# Patient Record
Sex: Female | Born: 1955 | Hispanic: No | Marital: Married | Smoking: Never smoker
Health system: Southern US, Community
[De-identification: ages and names within clinical notes are randomized; demographics above are authoritative.]

## PROBLEM LIST (undated history)

## (undated) DIAGNOSIS — I10 Essential (primary) hypertension: Secondary | ICD-10-CM

## (undated) DIAGNOSIS — K219 Gastro-esophageal reflux disease without esophagitis: Secondary | ICD-10-CM

## (undated) DIAGNOSIS — E785 Hyperlipidemia, unspecified: Secondary | ICD-10-CM

## (undated) DIAGNOSIS — F419 Anxiety disorder, unspecified: Secondary | ICD-10-CM

## (undated) DIAGNOSIS — F32A Depression, unspecified: Secondary | ICD-10-CM

## (undated) DIAGNOSIS — I8390 Asymptomatic varicose veins of unspecified lower extremity: Secondary | ICD-10-CM

## (undated) DIAGNOSIS — J302 Other seasonal allergic rhinitis: Secondary | ICD-10-CM

## (undated) DIAGNOSIS — F329 Major depressive disorder, single episode, unspecified: Secondary | ICD-10-CM

## (undated) DIAGNOSIS — D649 Anemia, unspecified: Secondary | ICD-10-CM

## (undated) DIAGNOSIS — H269 Unspecified cataract: Secondary | ICD-10-CM

## (undated) DIAGNOSIS — M25561 Pain in right knee: Secondary | ICD-10-CM

## (undated) DIAGNOSIS — M8580 Other specified disorders of bone density and structure, unspecified site: Secondary | ICD-10-CM

## (undated) DIAGNOSIS — M1990 Unspecified osteoarthritis, unspecified site: Secondary | ICD-10-CM

## (undated) HISTORY — DX: Gastro-esophageal reflux disease without esophagitis: K21.9

## (undated) HISTORY — DX: Essential (primary) hypertension: I10

## (undated) HISTORY — DX: Unspecified cataract: H26.9

## (undated) HISTORY — PX: NO PAST SURGERIES: SHX2092

## (undated) HISTORY — DX: Hyperlipidemia, unspecified: E78.5

## (undated) HISTORY — DX: Anemia, unspecified: D64.9

## (undated) HISTORY — DX: Unspecified osteoarthritis, unspecified site: M19.90

## (undated) HISTORY — DX: Depression, unspecified: F32.A

## (undated) HISTORY — DX: Other specified disorders of bone density and structure, unspecified site: M85.80

## (undated) HISTORY — DX: Pain in right knee: M25.561

## (undated) HISTORY — DX: Major depressive disorder, single episode, unspecified: F32.9

## (undated) HISTORY — DX: Other seasonal allergic rhinitis: J30.2

## (undated) HISTORY — DX: Asymptomatic varicose veins of unspecified lower extremity: I83.90

## (undated) HISTORY — DX: Anxiety disorder, unspecified: F41.9

## (undated) HISTORY — PX: COLONOSCOPY: SHX174

---

## 1998-09-11 ENCOUNTER — Encounter: Admission: RE | Admit: 1998-09-11 | Discharge: 1998-09-11 | Payer: Self-pay | Admitting: Obstetrics & Gynecology

## 1998-09-24 ENCOUNTER — Ambulatory Visit (HOSPITAL_COMMUNITY): Admission: RE | Admit: 1998-09-24 | Discharge: 1998-09-24 | Payer: Self-pay | Admitting: Obstetrics & Gynecology

## 1998-10-05 ENCOUNTER — Inpatient Hospital Stay (HOSPITAL_COMMUNITY): Admission: AD | Admit: 1998-10-05 | Discharge: 1998-10-05 | Payer: Self-pay | Admitting: *Deleted

## 1998-10-05 ENCOUNTER — Encounter: Admission: RE | Admit: 1998-10-05 | Discharge: 1998-10-05 | Payer: Self-pay | Admitting: Obstetrics & Gynecology

## 1998-10-19 ENCOUNTER — Encounter: Admission: RE | Admit: 1998-10-19 | Discharge: 1998-10-19 | Payer: Self-pay | Admitting: Obstetrics & Gynecology

## 1999-08-14 ENCOUNTER — Encounter: Admission: RE | Admit: 1999-08-14 | Discharge: 1999-10-02 | Payer: Self-pay | Admitting: Family Medicine

## 2000-12-07 ENCOUNTER — Other Ambulatory Visit: Admission: RE | Admit: 2000-12-07 | Discharge: 2000-12-07 | Payer: Self-pay | Admitting: Family Medicine

## 2002-07-01 ENCOUNTER — Other Ambulatory Visit: Admission: RE | Admit: 2002-07-01 | Discharge: 2002-07-01 | Payer: Self-pay | Admitting: Family Medicine

## 2002-11-13 ENCOUNTER — Emergency Department (HOSPITAL_COMMUNITY): Admission: EM | Admit: 2002-11-13 | Discharge: 2002-11-13 | Payer: Self-pay | Admitting: Emergency Medicine

## 2003-05-29 ENCOUNTER — Encounter: Payer: Self-pay | Admitting: Family Medicine

## 2003-05-29 ENCOUNTER — Encounter: Admission: RE | Admit: 2003-05-29 | Discharge: 2003-05-29 | Payer: Self-pay | Admitting: Family Medicine

## 2003-07-19 ENCOUNTER — Other Ambulatory Visit: Admission: RE | Admit: 2003-07-19 | Discharge: 2003-07-19 | Payer: Self-pay | Admitting: Family Medicine

## 2003-11-24 ENCOUNTER — Ambulatory Visit (HOSPITAL_COMMUNITY): Admission: RE | Admit: 2003-11-24 | Discharge: 2003-11-24 | Payer: Self-pay | Admitting: Family Medicine

## 2004-08-08 ENCOUNTER — Emergency Department (HOSPITAL_COMMUNITY): Admission: EM | Admit: 2004-08-08 | Discharge: 2004-08-08 | Payer: Self-pay | Admitting: Emergency Medicine

## 2004-08-09 ENCOUNTER — Encounter: Admission: RE | Admit: 2004-08-09 | Discharge: 2004-08-09 | Payer: Self-pay | Admitting: Family Medicine

## 2005-06-24 ENCOUNTER — Other Ambulatory Visit: Admission: RE | Admit: 2005-06-24 | Discharge: 2005-06-24 | Payer: Self-pay | Admitting: Family Medicine

## 2006-02-16 ENCOUNTER — Encounter: Admission: RE | Admit: 2006-02-16 | Discharge: 2006-02-16 | Payer: Self-pay | Admitting: Family Medicine

## 2006-07-16 ENCOUNTER — Other Ambulatory Visit: Admission: RE | Admit: 2006-07-16 | Discharge: 2006-07-16 | Payer: Self-pay | Admitting: Family Medicine

## 2007-03-21 ENCOUNTER — Emergency Department (HOSPITAL_COMMUNITY): Admission: EM | Admit: 2007-03-21 | Discharge: 2007-03-21 | Payer: Self-pay | Admitting: Emergency Medicine

## 2007-03-31 ENCOUNTER — Emergency Department (HOSPITAL_COMMUNITY): Admission: EM | Admit: 2007-03-31 | Discharge: 2007-03-31 | Payer: Self-pay | Admitting: *Deleted

## 2007-04-30 ENCOUNTER — Encounter: Admission: RE | Admit: 2007-04-30 | Discharge: 2007-04-30 | Payer: Self-pay | Admitting: Family Medicine

## 2008-01-19 ENCOUNTER — Emergency Department (HOSPITAL_BASED_OUTPATIENT_CLINIC_OR_DEPARTMENT_OTHER): Admission: EM | Admit: 2008-01-19 | Discharge: 2008-01-19 | Payer: Self-pay | Admitting: Emergency Medicine

## 2008-02-07 ENCOUNTER — Emergency Department (HOSPITAL_BASED_OUTPATIENT_CLINIC_OR_DEPARTMENT_OTHER): Admission: EM | Admit: 2008-02-07 | Discharge: 2008-02-07 | Payer: Self-pay | Admitting: Emergency Medicine

## 2008-03-06 ENCOUNTER — Ambulatory Visit (HOSPITAL_COMMUNITY): Admission: RE | Admit: 2008-03-06 | Discharge: 2008-03-06 | Payer: Self-pay | Admitting: Gastroenterology

## 2008-03-20 ENCOUNTER — Ambulatory Visit (HOSPITAL_COMMUNITY): Admission: RE | Admit: 2008-03-20 | Discharge: 2008-03-20 | Payer: Self-pay | Admitting: Gastroenterology

## 2008-05-15 ENCOUNTER — Encounter: Admission: RE | Admit: 2008-05-15 | Discharge: 2008-05-15 | Payer: Self-pay | Admitting: Family Medicine

## 2008-09-29 IMAGING — NM NM HEPATO W/GB/PHARM/[PERSON_NAME]
3 series · 13 of 13 positions shown · non-contrast
Comparison: Ultrasound on 03/06/2008

CLINICAL DATA: Right upper quadrant pain

NUCLEAR MEDICINE HEPATOBILIARY IMAGING WITH GALLBLADDER EF
TECHNIQUE: Sequential images of the abdomen were obtained [DATE] minutes following intravenous administration of
radiopharmaceutical. After oral ingestion of 8oz of half and half
cream, gallbladder ejection fraction was determined.
Radiopharmaceutical:  Z.[id number] Cc-RRm Choletec IV

[Series 1: he hepatobiliary · 1 of 1 slices shown (1 of 3)]
[im 1/1]
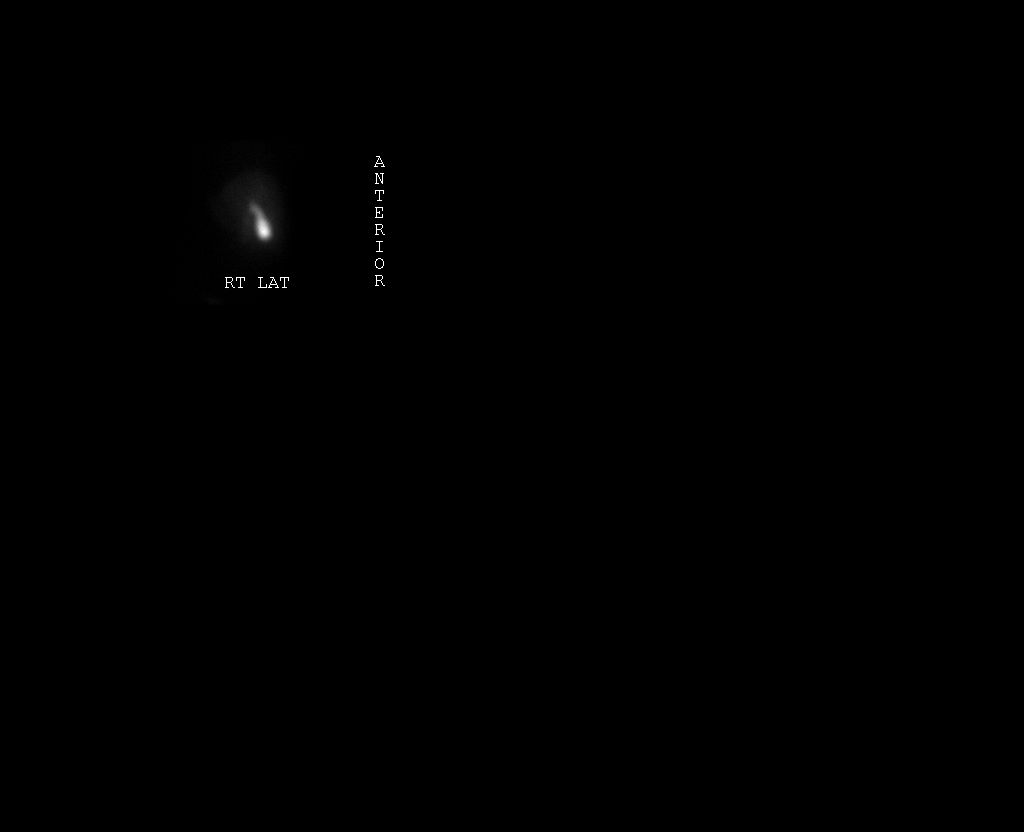

[Series 1: he hepatobiliary · 3.22mm/px · 6 of 60 frames shown (2 of 3)]
[frame 6/60]
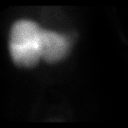
[frame 16/60]
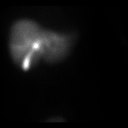
[frame 26/60]
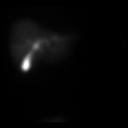
[frame 36/60]
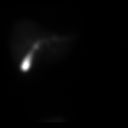
[frame 46/60]
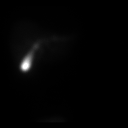
[frame 56/60]
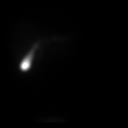

[Series 1: he hepatobiliary · 3.22mm/px · 6 of 60 frames shown (3 of 3)]
[frame 6/60]
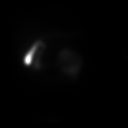
[frame 16/60]
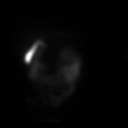
[frame 26/60]
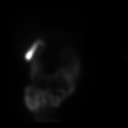
[frame 36/60]
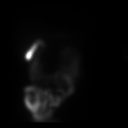
[frame 46/60]
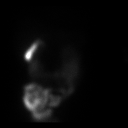
[frame 56/60]
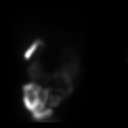

[13 of 13 positions shown; findings below may reference images not displayed]

FINDINGS: The study demonstrates patency of the cystic and biliary
ducts.  Gallbladder and biliary ducts are visualized at 15 minutes.
Small bowel activity is subsequently noted.  The gallbladder
ejection fraction is 79.8%.  Normally the ejection fraction is
greater than or equal to 50% at 1 hour.

The patient did not experience symptoms during oral ingestion of
Half-and-Half cream.
IMPRESSION: Patency of the cystic and biliary ducts. GB EF = 79.8%.

## 2008-12-08 ENCOUNTER — Encounter: Admission: RE | Admit: 2008-12-08 | Discharge: 2008-12-08 | Payer: Self-pay | Admitting: Orthopedic Surgery

## 2009-05-21 ENCOUNTER — Encounter: Admission: RE | Admit: 2009-05-21 | Discharge: 2009-05-21 | Payer: Self-pay | Admitting: Family Medicine

## 2009-09-03 ENCOUNTER — Ambulatory Visit: Payer: Self-pay | Admitting: Gynecology

## 2010-04-22 ENCOUNTER — Encounter: Admission: RE | Admit: 2010-04-22 | Discharge: 2010-04-22 | Payer: Self-pay | Admitting: Rheumatology

## 2010-05-27 ENCOUNTER — Encounter: Admission: RE | Admit: 2010-05-27 | Discharge: 2010-05-27 | Payer: Self-pay | Admitting: Family Medicine

## 2011-05-01 LAB — COMPREHENSIVE METABOLIC PANEL
ALT: 21
AST: 27
CO2: 29
Chloride: 103
Creatinine, Ser: 0.6
Glucose, Bld: 136 — ABNORMAL HIGH
Potassium: 3.6
Total Bilirubin: 0.3

## 2011-05-01 LAB — POCT CARDIAC MARKERS
CKMB, poc: 2.2
CKMB, poc: <1 — ABNORMAL LOW
Myoglobin, poc: 46.2
Operator id: 5506

## 2011-05-01 LAB — LIPASE, BLOOD: Lipase: 58

## 2011-05-01 LAB — CBC
MCHC: 34.8
MCV: 90
Platelets: 182
RBC: 4.26
RDW: 11.6

## 2011-05-01 LAB — DIFFERENTIAL
Basophils Relative: 0
Eosinophils Relative: 3
Lymphs Abs: 1.8
Monocytes Relative: 6
Neutro Abs: 5.9
Neutrophils Relative %: 70

## 2011-06-02 ENCOUNTER — Other Ambulatory Visit: Payer: Self-pay | Admitting: Family Medicine

## 2011-06-02 DIAGNOSIS — Z1231 Encounter for screening mammogram for malignant neoplasm of breast: Secondary | ICD-10-CM

## 2011-06-27 ENCOUNTER — Other Ambulatory Visit: Payer: Self-pay | Admitting: Obstetrics

## 2011-06-27 ENCOUNTER — Ambulatory Visit
Admission: RE | Admit: 2011-06-27 | Discharge: 2011-06-27 | Disposition: A | Discharge status: Home / Self Care | Payer: BC Managed Care – PPO | Source: Ambulatory Visit | Attending: Family Medicine | Admitting: Family Medicine

## 2011-06-27 DIAGNOSIS — Z1231 Encounter for screening mammogram for malignant neoplasm of breast: Secondary | ICD-10-CM

## 2012-06-22 ENCOUNTER — Other Ambulatory Visit: Payer: Self-pay | Admitting: Obstetrics & Gynecology

## 2012-06-22 DIAGNOSIS — Z1231 Encounter for screening mammogram for malignant neoplasm of breast: Secondary | ICD-10-CM

## 2012-08-02 ENCOUNTER — Ambulatory Visit
Admission: RE | Admit: 2012-08-02 | Discharge: 2012-08-02 | Disposition: A | Discharge status: Home / Self Care | Payer: BC Managed Care – PPO | Source: Ambulatory Visit | Attending: Obstetrics & Gynecology | Admitting: Obstetrics & Gynecology

## 2012-08-02 DIAGNOSIS — Z1231 Encounter for screening mammogram for malignant neoplasm of breast: Secondary | ICD-10-CM

## 2013-01-10 ENCOUNTER — Ambulatory Visit (INDEPENDENT_AMBULATORY_CARE_PROVIDER_SITE_OTHER): Payer: BC Managed Care – PPO | Admitting: Obstetrics & Gynecology

## 2013-01-10 ENCOUNTER — Other Ambulatory Visit: Payer: Self-pay | Admitting: Obstetrics & Gynecology

## 2013-01-10 ENCOUNTER — Encounter: Payer: Self-pay | Admitting: Obstetrics & Gynecology

## 2013-01-10 VITALS — BP 152/74 | HR 64 | Temp 98.2°F | Ht 64.0 in | Wt 155.2 lb

## 2013-01-10 DIAGNOSIS — R311 Benign essential microscopic hematuria: Secondary | ICD-10-CM | POA: Insufficient documentation

## 2013-01-10 DIAGNOSIS — N951 Menopausal and female climacteric states: Secondary | ICD-10-CM

## 2013-01-10 DIAGNOSIS — R319 Hematuria, unspecified: Secondary | ICD-10-CM

## 2013-01-10 LAB — POCT URINALYSIS DIPSTICK
Bilirubin, UA: NEGATIVE
Ketones, UA: NEGATIVE
Leukocytes, UA: NEGATIVE
Protein, UA: NEGATIVE
Spec Grav, UA: <=1.005
pH, UA: 7

## 2013-01-10 MED ORDER — ESTROGENS CONJUGATED 0.625 MG PO TABS: 0.6250 mg | ORAL_TABLET | Freq: Every day | ORAL | Status: DC

## 2013-01-10 MED ORDER — PROGESTERONE MICRONIZED 200 MG PO CAPS: 200.0000 mg | ORAL_CAPSULE | Freq: Every day | ORAL | Status: DC

## 2013-01-10 NOTE — Patient Instructions (Signed)
Hematuria, en el adulto (Hematuria, Adult) Usted presenta hematuria (sangre en la orina). La causa puede ser una infeccin en la vejiga (cistitis), una infeccin renal (pielonefritis) o una infeccin en la prstata (prostatitis) o clculos renales. Las infecciones responden a antibiticos (medicamentos que destruyen grmenes), y si se trata de clculos renales, generalmente pueden eliminarse con la orina sin ms tratamiento. Si le han indicado antibiticos, tome toda la cantidad prescripta hasta que se terminen. Podr sentirse bien dentro de unos das, pero debetomarlosmedicamentoshastaterminareltratamiento de lo contrario la infeccin puede no solucionarse y luego ser ms difcil de tratar. Si no le han prescripto antibiticos, la causa de la hematuria no es una infeccin. Ser necesario investigar en ms profundidad para conocer la causa. INSTRUCCIONES PARA EL CUIDADO DOMICILIARIO  Beba lquidos en abundancia, 3 a 4 litros por da. Si le han diagnosticado una infeccin, se recomienda especialmente el jugo de arndanos rojos, adems de grandes cantidades de agua.  Evite la cafena, el t y las bebidas carbonatadas, debido a que tienden a irritar la vejiga.  Evite el alcohol ya que puede irritar la prstata.  Utilice los medicamentos de venta libre o de prescripcin para el dolor, el malestar o la fiebre, segn se lo indique el profesional que lo asiste.  Si le han diagnosticado clculos renales, siga las intrucciones del profesional que lo asiste con respecto a filtrar la orina para rescatar el clculo. PARA PREVENIR FUTURAS INFECCIONES:  Vace la vejiga con frecuencia. Evite retener la orina durante largos perodos.  Despus de mover el intestino, las mujeres deben higienizarse la regin perineal desde adelante hacia atrs. Use slo un papel tissue por vez.  Si es una mujer, vace la vejiga antes y despus de tener relaciones sexuales.  Si presenta dolor de espalda, fiebre, nuseas (ganas de  vomitar), vmitos o sus sntomas (problemas) no mejoran en 3 das, concurra nuevamente a una consulta con el profesional que lo asiste. Vistelo antes si empeora. Si le han indicado que vuelva para realizar estudios ms profundos, asegrese de concertar las citas. Si la causa de la hematuria no es una infeccin, necesitar que le tomen radiografas. El profesional lo comentar con usted. SOLICITE ATENCIN MDICA DE INMEDIATO SI:  Presenta una temperatura persistente superior a 102 F (38.9 C).  Comienza a vomitar de manera preocupante o no puede tolerar los medicamentos.  Siente un dolor intenso en la espalda o el abdomen an tomando los medicamentos.  Elimina cogulos grandes o sangre con la orina.  Sufre una convulsin, se marea o pierde el conocimiento. EST SEGURO QUE:   Comprende las instrucciones para el alta mdica.  Controlar su enfermedad.  Solicitar atencin mdica de inmediato segn las indicaciones. Document Released: 07/21/2005 Document Revised: 10/13/2011 [employer] Patient Information 2014 [employer].  

## 2013-01-10 NOTE — Progress Notes (Signed)
.   Subjective:     Kathryn Jordan is a 57 y.o. female here for a follow exam.  Current complaints: She was at her primary care doctor and they told her that she has blood in her urine and to follow up with her GYN doctor.  She denies all symptoms of a UTI (pain, pressure, burning or pain).   Denies any incontinence or frequency.   Personal health questionnaire reviewed: no.   Gynecologic History No LMP recorded. Patient is postmenopausal. Contraception: none Last Pap: 06/2011. Results were: ASCUS Last mammogram: 07/2012 Results were: normal  Obstetric History OB History   Grav Para Term Preterm Abortions TAB SAB Ect Mult Living                   The following portions of the patient's history were reviewed and updated as appropriate: allergies, current medications, past family history, past medical history, past social history, past surgical history and problem list.  Review of Systems Pertinent items are noted in HPI.    Objective:    Abdomen: soft, non-tender; bowel sounds normal; no masses,  no organomegaly Pelvic: cervix normal in appearance, external genitalia normal, no adnexal masses or tenderness, no bladder tenderness, no cervical motion tenderness, uterus normal size, shape, and consistency and vagina normal without discharge    Assessment:    Menopausal hot flushes Hematuria   Plan:    Hormone replacement therapy: hormone replacement therapy: premarin/prometrium. Attempt to wean ERT Check U/A, urine C&S, urine cytology Possible referral-->Urology Return in a few weeks

## 2013-01-11 LAB — CYTOLOGY - NON PAP

## 2013-01-11 LAB — PAP IG AND HPV HIGH-RISK

## 2013-01-13 ENCOUNTER — Emergency Department (HOSPITAL_BASED_OUTPATIENT_CLINIC_OR_DEPARTMENT_OTHER)
Admission: EM | Admit: 2013-01-13 | Discharge: 2013-01-13 | Disposition: A | Discharge status: Home / Self Care | Payer: BC Managed Care – PPO | Attending: Emergency Medicine | Admitting: Emergency Medicine

## 2013-01-13 ENCOUNTER — Encounter (HOSPITAL_BASED_OUTPATIENT_CLINIC_OR_DEPARTMENT_OTHER): Payer: Self-pay

## 2013-01-13 ENCOUNTER — Emergency Department (HOSPITAL_BASED_OUTPATIENT_CLINIC_OR_DEPARTMENT_OTHER): Payer: BC Managed Care – PPO

## 2013-01-13 DIAGNOSIS — S79919A Unspecified injury of unspecified hip, initial encounter: Secondary | ICD-10-CM | POA: Insufficient documentation

## 2013-01-13 DIAGNOSIS — M25532 Pain in left wrist: Secondary | ICD-10-CM

## 2013-01-13 DIAGNOSIS — Y9289 Other specified places as the place of occurrence of the external cause: Secondary | ICD-10-CM | POA: Insufficient documentation

## 2013-01-13 DIAGNOSIS — W1809XA Striking against other object with subsequent fall, initial encounter: Secondary | ICD-10-CM | POA: Insufficient documentation

## 2013-01-13 DIAGNOSIS — S59919A Unspecified injury of unspecified forearm, initial encounter: Secondary | ICD-10-CM | POA: Insufficient documentation

## 2013-01-13 DIAGNOSIS — S46909A Unspecified injury of unspecified muscle, fascia and tendon at shoulder and upper arm level, unspecified arm, initial encounter: Secondary | ICD-10-CM | POA: Insufficient documentation

## 2013-01-13 DIAGNOSIS — Z79899 Other long term (current) drug therapy: Secondary | ICD-10-CM | POA: Insufficient documentation

## 2013-01-13 DIAGNOSIS — S4980XA Other specified injuries of shoulder and upper arm, unspecified arm, initial encounter: Secondary | ICD-10-CM | POA: Insufficient documentation

## 2013-01-13 DIAGNOSIS — S79929A Unspecified injury of unspecified thigh, initial encounter: Secondary | ICD-10-CM | POA: Insufficient documentation

## 2013-01-13 DIAGNOSIS — S6990XA Unspecified injury of unspecified wrist, hand and finger(s), initial encounter: Secondary | ICD-10-CM | POA: Insufficient documentation

## 2013-01-13 DIAGNOSIS — Y9389 Activity, other specified: Secondary | ICD-10-CM | POA: Insufficient documentation

## 2013-01-13 DIAGNOSIS — S59909A Unspecified injury of unspecified elbow, initial encounter: Secondary | ICD-10-CM | POA: Insufficient documentation

## 2013-01-13 DIAGNOSIS — S0990XA Unspecified injury of head, initial encounter: Secondary | ICD-10-CM | POA: Insufficient documentation

## 2013-01-13 DIAGNOSIS — W010XXA Fall on same level from slipping, tripping and stumbling without subsequent striking against object, initial encounter: Secondary | ICD-10-CM | POA: Insufficient documentation

## 2013-01-13 LAB — URINALYSIS

## 2013-01-13 MED ORDER — HYDROCODONE-ACETAMINOPHEN 5-325 MG PO TABS: 1.0000 | ORAL_TABLET | ORAL | Status: DC | PRN

## 2013-01-13 MED ORDER — IBUPROFEN 800 MG PO TABS
800.0000 mg | ORAL_TABLET | Freq: Once | ORAL | Status: AC
  Administered 2013-01-13: 800 mg via ORAL
  Filled 2013-01-13: qty 1

## 2013-01-13 NOTE — ED Notes (Signed)
Patient transported to X-ray 

## 2013-01-13 NOTE — ED Provider Notes (Signed)
History     CSN: [id number]  Arrival date & time 01/13/13  1450   First MD Initiated Contact with Patient 01/13/13 1507      Chief Complaint  Patient presents with  . Fall  . Wrist Pain  . Hip Pain  . Shoulder Pain    (Consider location/radiation/quality/duration/timing/severity/associated sxs/prior treatment) HPI  Patient is a 57 yo F presenting to the ED for left wrist, shoulder, and hip pain after sustaining a fall last evening. Patient states she slipped on the kitchen floor, lost her balance and hit her left side against the fridge and fell down to the ground. Patient denies any LOC. Left wrist pain is the primary source of complaint for patient. Pt states it has been becoming increasingly more painful since incident last night w/ increased swelling. Pt describes pain as deep achy pain w/ radiation to elbow when she tries to use wrist. Pt states she has been unable to use left wrist d/t increased pain and swelling. Denies any previous injury to wrist or hand. Denies any numbness or tingling. No alleviating or aggravating factors. Rates pain Denies fevers, chills, CP, SOB.    History reviewed. No pertinent past medical history.  History reviewed. No pertinent past surgical history.  No family history on file.  History  Substance Use Topics  . Smoking status: Never Smoker   . Smokeless tobacco: Not on file  . Alcohol Use: 0.5 oz/week    1 drink(s) per week    OB History   Grav Para Term Preterm Abortions TAB SAB Ect Mult Living                  Review of Systems  Constitutional: Negative for fever and chills.  HENT: Negative for neck pain.   Eyes: Negative.   Respiratory: Negative for chest tightness and shortness of breath.   Cardiovascular: Negative for chest pain and leg swelling.  Gastrointestinal: Negative.   Endocrine: Negative.   Musculoskeletal: Positive for back pain and joint swelling.  Skin: Negative.   Neurological: Positive for headaches. Negative  for dizziness, tremors, seizures, syncope, facial asymmetry, speech difficulty, weakness, light-headedness and numbness.    Allergies  Review of patient's allergies indicates no known allergies.  Home Medications   Current Outpatient Rx  Name  Route  Sig  Dispense  Refill  . estrogens, conjugated, (PREMARIN) 0.625 MG tablet   Oral   Take 1 tablet (0.625 mg total) by mouth daily.   90 tablet   1   . FLUoxetine (PROZAC) 10 MG capsule   Oral   Take 10 mg by mouth daily.         Marland Kitchen HYDROcodone-acetaminophen (NORCO/VICODIN) 5-325 MG per tablet   Oral   Take 1 tablet by mouth every 4 (four) hours as needed for pain.   15 tablet   0   . progesterone (PROMETRIUM) 200 MG capsule   Oral   Take 1 capsule (200 mg total) by mouth daily. 12 days/ month   36 capsule   1     BP 145/58  Pulse 74  Temp(Src) 99.3 F (37.4 C) (Oral)  Resp 16  Ht 5\' 4"  (1.626 m)  Wt 140 lb (63.504 kg)  BMI 24.02 kg/m2  SpO2 98%  Physical Exam  Constitutional: She is oriented to person, place, and time. She appears well-developed and well-nourished.  HENT:  Head: Normocephalic and atraumatic.  Eyes: EOM are normal. Pupils are equal, round, and reactive to light.  Cardiovascular: Normal rate,  regular rhythm and normal heart sounds.   Pulses:      Radial pulses are 2+ on the right side, and 2+ on the left side.  Pulmonary/Chest: Effort normal and breath sounds normal.  Abdominal: Soft. Bowel sounds are normal.  Musculoskeletal:       Left shoulder: Normal.       Left elbow: Normal.       Left wrist: She exhibits decreased range of motion, tenderness and swelling. She exhibits no crepitus and no laceration.       Cervical back: Normal.       Back:       Arms:      Left hand: Normal.  Neurological: She is alert and oriented to person, place, and time. She has normal strength. No cranial nerve deficit or sensory deficit.  No pronator drift.   Skin: Skin is warm and dry.  Psychiatric: She has a  normal mood and affect.    ED Course  Procedures (including critical care time)  Labs Reviewed - No data to display Dg Wrist Complete Left  01/13/2013   *RADIOLOGY REPORT*  Clinical Data: Ulnar left wrist pain following a fall.  LEFT WRIST - COMPLETE 3+ VIEW  Comparison: Bilateral hand and wrist radiographs obtained at [facility] [place] on 03/12/2010.  Findings: Normal appearing bones and soft tissues without fracture or dislocation.  IMPRESSION: Normal examination.   Original Report Authenticated By: Beckie Salts, M.D.     1. Wrist pain, acute, left       MDM  No neurological findings. No posterior midline tenderness. Imaging shows no fracture, but d/t swelling patient will be splinted with follow up with Dr. Pearletha Forge for re-evaluation of injury. Directed pt to ice injury and to elevate and rest the injury when possible. Advised to maintain splint until evaluated by Dr. Pearletha Forge. Pain medication provided. Patient agreeable to plan. Patient d/w with Dr. Rosalia Hammers, agrees with plan. Patient is stable at time of discharge.         Jeannetta Ellis, PA-C 01/15/13 0020

## 2013-01-13 NOTE — ED Notes (Addendum)
Pt reports she lost her balance last pm, fell striking head on the floor.  Denies LOC.  Reports left wrist, left shoulder and left hip pain.  She was seen by PMD this am.

## 2013-01-17 ENCOUNTER — Ambulatory Visit (INDEPENDENT_AMBULATORY_CARE_PROVIDER_SITE_OTHER): Payer: BC Managed Care – PPO | Admitting: Family Medicine

## 2013-01-17 ENCOUNTER — Encounter: Payer: Self-pay | Admitting: Family Medicine

## 2013-01-17 VITALS — BP 117/73 | HR 66 | Ht 64.0 in | Wt 150.0 lb

## 2013-01-17 DIAGNOSIS — S59909A Unspecified injury of unspecified elbow, initial encounter: Secondary | ICD-10-CM

## 2013-01-17 DIAGNOSIS — S6990XA Unspecified injury of unspecified wrist, hand and finger(s), initial encounter: Secondary | ICD-10-CM

## 2013-01-17 DIAGNOSIS — S6992XA Unspecified injury of left wrist, hand and finger(s), initial encounter: Secondary | ICD-10-CM

## 2013-01-17 MED ORDER — HYDROCODONE-ACETAMINOPHEN 5-325 MG PO TABS: 1.0000 | ORAL_TABLET | Freq: Four times a day (QID) | ORAL | Status: DC | PRN

## 2013-01-17 NOTE — Patient Instructions (Addendum)
You have a left wrist sprain. Wear the brace as often as possible for support and protection. Ok to take off to bathe, ice, and sleep (if it doesn't hurt when sleeping). Ice 15 minutes at a time 3-4 times a day. Ibuprofen 600mg  three times a day with food for pain and inflammation. Norco as needed for severe pain. Elevate above the level of your heart as much as possible. Usually sprains take about 3-4 weeks to completely improve. Follow up with me in 2 weeks for reevaluation.

## 2013-01-18 ENCOUNTER — Encounter: Payer: Self-pay | Admitting: Family Medicine

## 2013-01-18 DIAGNOSIS — S6991XA Unspecified injury of right wrist, hand and finger(s), initial encounter: Secondary | ICD-10-CM | POA: Insufficient documentation

## 2013-01-18 DIAGNOSIS — S6992XA Unspecified injury of left wrist, hand and finger(s), initial encounter: Secondary | ICD-10-CM | POA: Insufficient documentation

## 2013-01-18 NOTE — Assessment & Plan Note (Signed)
Left wrist sprain - radiographs negative for fracture.  Not tender on scaphoid or scapholunate ligament region.  Reassured patient.  Brace, icing, nsaids with norco as needed for severe pain.  F/u in 2 weeks for reevaluation.

## 2013-01-18 NOTE — Assessment & Plan Note (Signed)
Right wrist sprain - radiographs negative for fracture.  Not tender on scaphoid or scapholunate ligament region.  Reassured patient.  Brace, icing, nsaids with norco as needed for severe pain.  F/u in 2 weeks for reevaluation.

## 2013-01-18 NOTE — Progress Notes (Signed)
Patient ID: CAELYN ROUTE, female   DOB: [birth year], 57 y.o.   MRN: [id number]  PCP: No primary provider on file.  Subjective:   HPI: Patient is a 57 y.o. female here for left wrist injury.  Patient reports on 6/11 she was in the kitchen when she slipped on the wet floor. Not sure exactly how she injured left wrist - thinks she may have put hand out to brace her fall. Hit her head, landed on the left side. No LOC. Had some initial soreness in shoulder, elbow but pain now only in her left wrist. + bruising and swelling. X-rays in ED negative for fracture. Placed in a brace, given norco and also taking aleve and icing. No prior left wrist injuries. Is right handed.  History reviewed. No pertinent past medical history.  Current Outpatient Prescriptions on File Prior to Visit  Medication Sig Dispense Refill  . estrogens, conjugated, (PREMARIN) 0.625 MG tablet Take 1 tablet (0.625 mg total) by mouth daily.  90 tablet  1  . FLUoxetine (PROZAC) 10 MG capsule Take 10 mg by mouth daily.      . progesterone (PROMETRIUM) 200 MG capsule Take 1 capsule (200 mg total) by mouth daily. 12 days/ month  36 capsule  1   No current facility-administered medications on file prior to visit.    History reviewed. No pertinent past surgical history.  Allergies  Allergen Reactions  . Meloxicam     History   Social History  . Marital Status: Married    Spouse Name: N/A    Number of Children: N/A  . Years of Education: N/A   Occupational History  . Not on file.   Social History Main Topics  . Smoking status: Never Smoker   . Smokeless tobacco: Not on file  . Alcohol Use: 0.5 oz/week    1 drink(s) per week  . Drug Use: No  . Sexually Active: No   Other Topics Concern  . Not on file   Social History Narrative  . No narrative on file    Family History  Problem Relation Age of Onset  . Diabetes Sister   . Heart attack Neg Hx   . Hyperlipidemia Neg Hx   . Hypertension Neg Hx   .  Sudden death Neg Hx     BP 117/73  Pulse 66  Ht 5\' 4"  (1.626 m)  Wt 150 lb (68.04 kg)  BMI 25.73 kg/m2  Review of Systems: See HPI above.    Objective:  Physical Exam:  Gen: NAD  L wrist: Mild swelling dorsal and volar wrist.  Bruising both sides as well.  No other deformity.  No angulation, malrotation of digits. TTP at wrist joint dorsal > volar.  No focal bony tenderness including distal radius, ulna, snuffbox, scapholunate region.   Able to flex and extend wrist with pain.  FROM digits. Negative tinels carpal tunnel. NVI distally.    Assessment & Plan:  1. Left wrist sprain - radiographs negative for fracture.  Not tender on scaphoid or scapholunate ligament region.  Reassured patient.  Brace, icing, nsaids with norco as needed for severe pain.  F/u in 2 weeks for reevaluation.

## 2013-01-22 NOTE — ED Provider Notes (Signed)
History/physical exam/procedure(s) were performed by non-physician practitioner and as supervising physician I was immediately available for consultation/collaboration. I have reviewed all notes and am in agreement with care and plan.   Hilario Quarry, MD 01/22/13 [phone]

## 2013-01-24 ENCOUNTER — Encounter: Payer: Self-pay | Admitting: Obstetrics & Gynecology

## 2013-01-24 ENCOUNTER — Other Ambulatory Visit: Payer: Self-pay | Admitting: Obstetrics & Gynecology

## 2013-01-24 ENCOUNTER — Ambulatory Visit (INDEPENDENT_AMBULATORY_CARE_PROVIDER_SITE_OTHER): Payer: BC Managed Care – PPO | Admitting: Obstetrics & Gynecology

## 2013-01-24 VITALS — BP 145/80 | HR 78 | Temp 97.9°F | Ht 64.0 in | Wt 156.0 lb

## 2013-01-24 DIAGNOSIS — N951 Menopausal and female climacteric states: Secondary | ICD-10-CM

## 2013-01-24 DIAGNOSIS — R319 Hematuria, unspecified: Secondary | ICD-10-CM

## 2013-01-24 NOTE — Patient Instructions (Addendum)
Hematuria, en el adulto (Hematuria, Adult) Usted presenta hematuria (sangre en la orina). La causa puede ser una infeccin en la vejiga (cistitis), una infeccin renal (pielonefritis) o una infeccin en la prstata (prostatitis) o clculos renales. Las infecciones responden a antibiticos (medicamentos que destruyen grmenes), y si se trata de clculos renales, generalmente pueden eliminarse con la orina sin ms tratamiento. Si le han indicado antibiticos, tome toda la cantidad prescripta hasta que se terminen. Podr sentirse bien dentro de [address], pero debetomarlosmedicamentoshastaterminareltratamiento de lo contrario la infeccin puede no solucionarse y luego ser ms difcil de Warehouse manager. Si no le han prescripto antibiticos, la causa de la hematuria no es una infeccin. Ser necesario investigar en ms profundidad para conocer la causa. INSTRUCCIONES PARA EL CUIDADO DOMICILIARIO  Beba lquidos en abundancia, 3 a 4 litros por da. Si le han diagnosticado una infeccin, se recomienda especialmente el jugo de arndanos rojos, [address] de grandes cantidades de [place].  Evite la cafena, el t y las bebidas carbonatadas, debido a que tienden a Surveyor, minerals.  Evite el alcohol ya que puede Teacher, adult education.  Utilice los medicamentos de venta libre o de prescripcin para Chief Technology Officer, Environmental health practitioner o la [place], segn se lo indique el profesional que lo asiste.  Si le han diagnosticado clculos renales, siga las intrucciones del profesional que lo asiste con respecto a Ecologist la orina para TEFL teacher clculo. PARA PREVENIR FUTURAS INFECCIONES:  Vace la vejiga con frecuencia. Evite retener la orina durante largos perodos.  Despus de mover el intestino, las mujeres deben higienizarse la regin perineal desde adelante hacia atrs. Use slo un papel tissue por vez.  Si es [employer], vace la vejiga antes y despus de Management consultant.  Si presenta dolor de espalda, fiebre, nuseas (ganas de  vomitar), vmitos o sus sntomas (problemas) no mejoran en 3 das, concurra nuevamente a una consulta con el profesional que lo asiste. Vistelo antes si empeora. Si le han indicado que vuelva para realizar estudios ms profundos, asegrese de [employer]. Si la causa de la hematuria no es una infeccin, necesitar que le tomen radiografas. El profesional lo comentar con usted. SOLICITE ATENCIN MDICA DE INMEDIATO SI:  Presenta una temperatura persistente superior a 102 F (38.9 C).  Comienza a vomitar de Building services engineer o no puede [employer].  Siente un dolor intenso en la espalda o el abdomen an [employer].  Elimina cogulos grandes o sangre con la [place].  Sufre una convulsin, se marea o pierde el conocimiento. EST SEGURO QUE:   Comprende las instrucciones para el alta mdica.  Controlar su enfermedad.  Solicitar atencin mdica de inmediato segn las indicaciones. Document Released: 07/21/2005 Document Revised: 10/13/2011 [facility] Patient Information 2014 [place], [place].

## 2013-01-24 NOTE — Progress Notes (Signed)
Subjective:     Kathryn Jordan is a 57 y.o. female here for follow up on blood in urine.  Current complaints: are abdominal pain and urinary frequency for the past week Personal health questionnaire reviewed: not asked.     The following portions of the patient's history were reviewed and updated as appropriate: allergies, current medications, past family history, past medical history, past social history, past surgical history and problem list.  Review of Systems Pertinent items are noted in HPI.    Objective:     No exam today                  Assessment:   Hematuria Urine cytology w/atypical cells  Plan:    CT urogram Referral --> Urology

## 2013-01-25 ENCOUNTER — Encounter: Payer: Self-pay | Admitting: Obstetrics & Gynecology

## 2013-01-26 ENCOUNTER — Ambulatory Visit (HOSPITAL_COMMUNITY)
Admission: RE | Admit: 2013-01-26 | Discharge: 2013-01-26 | Disposition: A | Discharge status: Home / Self Care | Payer: BC Managed Care – PPO | Source: Ambulatory Visit | Attending: Obstetrics & Gynecology | Admitting: Obstetrics & Gynecology

## 2013-01-26 DIAGNOSIS — R319 Hematuria, unspecified: Secondary | ICD-10-CM | POA: Insufficient documentation

## 2013-01-28 ENCOUNTER — Ambulatory Visit (HOSPITAL_BASED_OUTPATIENT_CLINIC_OR_DEPARTMENT_OTHER)
Admission: RE | Admit: 2013-01-28 | Discharge: 2013-01-28 | Disposition: A | Discharge status: Home / Self Care | Payer: BC Managed Care – PPO | Source: Ambulatory Visit | Attending: Family Medicine | Admitting: Family Medicine

## 2013-01-28 ENCOUNTER — Ambulatory Visit (INDEPENDENT_AMBULATORY_CARE_PROVIDER_SITE_OTHER): Payer: BC Managed Care – PPO | Admitting: Family Medicine

## 2013-01-28 ENCOUNTER — Encounter: Payer: Self-pay | Admitting: Family Medicine

## 2013-01-28 VITALS — BP 118/75 | HR 61 | Ht 64.0 in | Wt 148.0 lb

## 2013-01-28 DIAGNOSIS — M25532 Pain in left wrist: Secondary | ICD-10-CM

## 2013-01-28 DIAGNOSIS — Z5189 Encounter for other specified aftercare: Secondary | ICD-10-CM

## 2013-01-28 DIAGNOSIS — S6992XD Unspecified injury of left wrist, hand and finger(s), subsequent encounter: Secondary | ICD-10-CM

## 2013-01-28 DIAGNOSIS — M25539 Pain in unspecified wrist: Secondary | ICD-10-CM | POA: Insufficient documentation

## 2013-01-28 NOTE — Patient Instructions (Signed)
We will go ahead with an MRI of your wrist to assess for an occult fracture (one not seen on x-rays). You can either come back the day after the MRI or I can call you with the results the business day after the MRI. In meantime continue with brace, medications as needed.

## 2013-01-31 ENCOUNTER — Encounter: Payer: Self-pay | Admitting: Family Medicine

## 2013-01-31 NOTE — Assessment & Plan Note (Signed)
patient has tenderness at scaphoid and scapholunate areas.  Concern for occult fracture - repeat radiographs negative.  Will move forward with MRI to assess for occult fracture or scapholunate dissociation.  Bracing, icing, nsaids, norco as needed in meantime.

## 2013-01-31 NOTE — Progress Notes (Addendum)
Patient ID: Kathryn Jordan, female   DOB: 16-May-1956, 57 y.o.   MRN: [id number]  PCP: No primary provider on file.  Subjective:   HPI: Patient is a 57 y.o. female here for f/u left wrist injury.  6/16: Patient reports on 6/11 she was in the kitchen when she slipped on the wet floor. Not sure exactly how she injured left wrist - thinks she may have put hand out to brace her fall. Hit her head, landed on the left side. No LOC. Had some initial soreness in shoulder, elbow but pain now only in her left wrist. + bruising and swelling. X-rays in ED negative for fracture. Placed in a brace, given norco and also taking aleve and icing. No prior left wrist injuries. Is right handed.  6/27: Patient returns with continued left wrist pain. States she has improved some since last visit. Wearing wrist brace for support and protection regularly. Norco makes her too sleepy so she tried 1/2 of a tablet as needed. Finger movements bother her.   History reviewed. No pertinent past medical history.  Current Outpatient Prescriptions on File Prior to Visit  Medication Sig Dispense Refill  . estrogens, conjugated, (PREMARIN) 0.625 MG tablet Take 1 tablet (0.625 mg total) by mouth daily.  90 tablet  1  . FLUoxetine (PROZAC) 10 MG capsule Take 10 mg by mouth daily.      Marland Kitchen HYDROcodone-acetaminophen (NORCO/VICODIN) 5-325 MG per tablet Take 1 tablet by mouth every 6 (six) hours as needed for pain.  60 tablet  0  . progesterone (PROMETRIUM) 200 MG capsule Take 1 capsule (200 mg total) by mouth daily. 12 days/ month  36 capsule  1   No current facility-administered medications on file prior to visit.    Past Surgical History  Procedure Laterality Date  . No past surgeries      Allergies  Allergen Reactions  . Meloxicam     History   Social History  . Marital Status: Married    Spouse Name: N/A    Number of Children: N/A  . Years of Education: N/A   Occupational History  . Not on file.    Social History Main Topics  . Smoking status: Never Smoker   . Smokeless tobacco: Never Used  . Alcohol Use: 0.5 oz/week    1 drink(s) per week  . Drug Use: No  . Sexually Active: No   Other Topics Concern  . Not on file   Social History Narrative  . No narrative on file    Family History  Problem Relation Age of Onset  . Diabetes Sister   . Heart attack Neg Hx   . Hyperlipidemia Neg Hx   . Hypertension Neg Hx   . Sudden death Neg Hx     BP 118/75  Pulse 61  Ht 5\' 4"  (1.626 m)  Wt 148 lb (67.132 kg)  BMI 25.39 kg/m2  Review of Systems: See HPI above.    Objective:  Physical Exam:  Gen: NAD  L wrist: Mild swelling dorsal and volar wrist.  No longer with bruising.  No other deformity.  No angulation, malrotation of digits. TTP at wrist joint dorsally - now has tenderness at snuffbox, distal radius, over scapholunate region.  No ulna, other wrist/hand TTP.  Able to flex and extend wrist with pain.  FROM digits. Negative tinels carpal tunnel. NVI distally.    Assessment & Plan:  1. Left wrist pain - patient has tenderness at scaphoid and scapholunate areas.  Concern for occult fracture - repeat radiographs negative.  Will move forward with MRI to assess for occult fracture or scapholunate dissociation.  Bracing, icing, nsaids, norco as needed in meantime.  Addendum:  MRI results reviewed and discussed with patient.  She surprisingly has an intraarticular distal radius styloid fracture but this is nondisplaced.  She will come in today for immobilization.  These fractures are at higher risk of displacement though.  Will f/u in 2 weeks after that to remove cast, repeat radiographs.

## 2013-02-01 ENCOUNTER — Ambulatory Visit (INDEPENDENT_AMBULATORY_CARE_PROVIDER_SITE_OTHER): Payer: BC Managed Care – PPO

## 2013-02-01 DIAGNOSIS — M25532 Pain in left wrist: Secondary | ICD-10-CM

## 2013-02-01 DIAGNOSIS — S52539A Colles' fracture of unspecified radius, initial encounter for closed fracture: Secondary | ICD-10-CM

## 2013-02-01 DIAGNOSIS — W19XXXA Unspecified fall, initial encounter: Secondary | ICD-10-CM

## 2013-02-03 ENCOUNTER — Ambulatory Visit (INDEPENDENT_AMBULATORY_CARE_PROVIDER_SITE_OTHER): Payer: Self-pay | Admitting: Family Medicine

## 2013-02-03 ENCOUNTER — Encounter: Payer: Self-pay | Admitting: Family Medicine

## 2013-02-03 VITALS — BP 139/76 | HR 71 | Ht 64.0 in | Wt 150.0 lb

## 2013-02-03 DIAGNOSIS — Z5189 Encounter for other specified aftercare: Secondary | ICD-10-CM

## 2013-02-03 DIAGNOSIS — S6992XD Unspecified injury of left wrist, hand and finger(s), subsequent encounter: Secondary | ICD-10-CM

## 2013-02-07 ENCOUNTER — Encounter: Payer: Self-pay | Admitting: Family Medicine

## 2013-02-07 NOTE — Assessment & Plan Note (Signed)
radiographs were negative but MRI shows a nondisplaced distal radius styloid fracture.  These are at higher risk of displacement - will need to monitor closely but a good sign she has gone over 2 weeks without displacement.  Short arm cast placed today.  Follow up in 2 weeks to remove cast, repeat radiographs.

## 2013-02-07 NOTE — Progress Notes (Signed)
Patient ID: Kathryn Jordan, female   DOB: 1956-05-15, 57 y.o.   MRN: [id number]  PCP: No primary provider on file.  Subjective:   HPI: Patient is a 57 y.o. female here for f/u left wrist injury.  6/16: Patient reports on 6/11 she was in the kitchen when she slipped on the wet floor. Not sure exactly how she injured left wrist - thinks she may have put hand out to brace her fall. Hit her head, landed on the left side. No LOC. Had some initial soreness in shoulder, elbow but pain now only in her left wrist. + bruising and swelling. X-rays in ED negative for fracture. Placed in a brace, given norco and also taking aleve and icing. No prior left wrist injuries. Is right handed.  6/27: Patient returns with continued left wrist pain. States she has improved some since last visit. Wearing wrist brace for support and protection regularly. Norco makes her too sleepy so she tried 1/2 of a tablet as needed. Finger movements bother her.  7/3: Patient returns for left cast to be placed.  History reviewed. No pertinent past medical history.  Current Outpatient Prescriptions on File Prior to Visit  Medication Sig Dispense Refill  . estrogens, conjugated, (PREMARIN) 0.625 MG tablet Take 1 tablet (0.625 mg total) by mouth daily.  90 tablet  1  . FLUoxetine (PROZAC) 10 MG capsule Take 10 mg by mouth daily.      Marland Kitchen HYDROcodone-acetaminophen (NORCO/VICODIN) 5-325 MG per tablet Take 1 tablet by mouth every 6 (six) hours as needed for pain.  60 tablet  0  . progesterone (PROMETRIUM) 200 MG capsule Take 1 capsule (200 mg total) by mouth daily. 12 days/ month  36 capsule  1   No current facility-administered medications on file prior to visit.    Past Surgical History  Procedure Laterality Date  . No past surgeries      Allergies  Allergen Reactions  . Meloxicam     History   Social History  . Marital Status: Married    Spouse Name: N/A    Number of Children: N/A  . Years of  Education: N/A   Occupational History  . Not on file.   Social History Main Topics  . Smoking status: Never Smoker   . Smokeless tobacco: Never Used  . Alcohol Use: 0.5 oz/week    1 drink(s) per week  . Drug Use: No  . Sexually Active: No   Other Topics Concern  . Not on file   Social History Narrative  . No narrative on file    Family History  Problem Relation Age of Onset  . Diabetes Sister   . Heart attack Neg Hx   . Hyperlipidemia Neg Hx   . Hypertension Neg Hx   . Sudden death Neg Hx     BP 139/76  Pulse 71  Ht 5\' 4"  (1.626 m)  Wt 150 lb (68.04 kg)  BMI 25.73 kg/m2  Review of Systems: See HPI above.    Objective:  Physical Exam:  Gen: NAD Exam not repeated today  L wrist: Mild swelling dorsal and volar wrist.  No longer with bruising.  No other deformity.  No angulation, malrotation of digits. TTP at wrist joint dorsally - now has tenderness at snuffbox, distal radius, over scapholunate region.  No ulna, other wrist/hand TTP.  Able to flex and extend wrist with pain.  FROM digits. Negative tinels carpal tunnel. NVI distally.    Assessment & Plan:  1.  Left wrist pain - radiographs were negative but MRI shows a nondisplaced distal radius styloid fracture.  These are at higher risk of displacement - will need to monitor closely but a good sign she has gone over 2 weeks without displacement.  Short arm cast placed today.  Follow up in 2 weeks to remove cast, repeat radiographs.

## 2013-02-16 ENCOUNTER — Encounter: Payer: Self-pay | Admitting: Obstetrics & Gynecology

## 2013-02-17 ENCOUNTER — Ambulatory Visit (HOSPITAL_BASED_OUTPATIENT_CLINIC_OR_DEPARTMENT_OTHER)
Admission: RE | Admit: 2013-02-17 | Discharge: 2013-02-17 | Disposition: A | Discharge status: Home / Self Care | Payer: BC Managed Care – PPO | Source: Ambulatory Visit | Attending: Family Medicine | Admitting: Family Medicine

## 2013-02-17 ENCOUNTER — Ambulatory Visit (INDEPENDENT_AMBULATORY_CARE_PROVIDER_SITE_OTHER): Payer: BC Managed Care – PPO | Admitting: Family Medicine

## 2013-02-17 ENCOUNTER — Encounter: Payer: Self-pay | Admitting: Family Medicine

## 2013-02-17 VITALS — BP 114/70 | HR 71 | Ht 64.0 in | Wt 148.0 lb

## 2013-02-17 DIAGNOSIS — X58XXXA Exposure to other specified factors, initial encounter: Secondary | ICD-10-CM | POA: Insufficient documentation

## 2013-02-17 DIAGNOSIS — M25532 Pain in left wrist: Secondary | ICD-10-CM

## 2013-02-17 DIAGNOSIS — Z5189 Encounter for other specified aftercare: Secondary | ICD-10-CM

## 2013-02-17 DIAGNOSIS — S6992XD Unspecified injury of left wrist, hand and finger(s), subsequent encounter: Secondary | ICD-10-CM

## 2013-02-17 DIAGNOSIS — M25539 Pain in unspecified wrist: Secondary | ICD-10-CM

## 2013-02-17 DIAGNOSIS — S52599A Other fractures of lower end of unspecified radius, initial encounter for closed fracture: Secondary | ICD-10-CM | POA: Insufficient documentation

## 2013-02-21 ENCOUNTER — Encounter: Payer: Self-pay | Admitting: Family Medicine

## 2013-02-21 NOTE — Progress Notes (Signed)
Patient ID: Kathryn Jordan, female   DOB: [birth year], 57 y.o.   MRN: [id number]  PCP: No primary provider on file.  Subjective:   HPI: Patient is a 57 y.o. female here for f/u left wrist injury.  6/16: Patient reports on 6/11 she was in the kitchen when she slipped on the wet floor. Not sure exactly how she injured left wrist - thinks she may have put hand out to brace her fall. Hit her head, landed on the left side. No LOC. Had some initial soreness in shoulder, elbow but pain now only in her left wrist. + bruising and swelling. X-rays in ED negative for fracture. Placed in a brace, given norco and also taking aleve and icing. No prior left wrist injuries. Is right handed.  6/27: Patient returns with continued left wrist pain. States she has improved some since last visit. Wearing wrist brace for support and protection regularly. Norco makes her too sleepy so she tried 1/2 of a tablet as needed. Finger movements bother her.  7/3: Patient returns for left cast to be placed.  7/17: Patient returns for f/u distal radius styloid fracture. She states not having any pain in cast currently - some soreness occasionally. No other complaints. Not taking anything for pain now.  History reviewed. No pertinent past medical history.  Current Outpatient Prescriptions on File Prior to Visit  Medication Sig Dispense Refill  . estrogens, conjugated, (PREMARIN) 0.625 MG tablet Take 1 tablet (0.625 mg total) by mouth daily.  90 tablet  1  . FLUoxetine (PROZAC) 10 MG capsule Take 10 mg by mouth daily.      Marland Kitchen HYDROcodone-acetaminophen (NORCO/VICODIN) 5-325 MG per tablet Take 1 tablet by mouth every 6 (six) hours as needed for pain.  60 tablet  0  . progesterone (PROMETRIUM) 200 MG capsule Take 1 capsule (200 mg total) by mouth daily. 12 days/ month  36 capsule  1   No current facility-administered medications on file prior to visit.    Past Surgical History  Procedure Laterality Date  .  No past surgeries      Allergies  Allergen Reactions  . Meloxicam     History   Social History  . Marital Status: Married    Spouse Name: N/A    Number of Children: N/A  . Years of Education: N/A   Occupational History  . Not on file.   Social History Main Topics  . Smoking status: Never Smoker   . Smokeless tobacco: Never Used  . Alcohol Use: 0.5 oz/week    1 drink(s) per week  . Drug Use: No  . Sexually Active: No   Other Topics Concern  . Not on file   Social History Narrative  . No narrative on file    Family History  Problem Relation Age of Onset  . Diabetes Sister   . Heart attack Neg Hx   . Hyperlipidemia Neg Hx   . Hypertension Neg Hx   . Sudden death Neg Hx     BP 114/70  Pulse 71  Ht 5\' 4"  (1.626 m)  Wt 148 lb (67.132 kg)  BMI 25.39 kg/m2  Review of Systems: See HPI above.    Objective:  Physical Exam:  Gen: NAD  L wrist: Cast removed. No swelling dorsal and volar wrist.  No longer with bruising.  No other deformity.  No angulation, malrotation of digits. Minimal TTP at wrist joint dorsally, distal radius.  No TTP snuffbox, over scapholunate region.  No ulna,  other wrist/hand TTP.  FROM digits and wrist. NVI distally.    Assessment & Plan:  1. Left wrist pain - Radiographs show no additional displacement of nondisplaced distal radius styloid fracture.  Clinically healing as well.  New cast placed today then f/u in about 2 to 2 1/2 weeks for cast removal, repeat x-rays.  May need formal PT to regain strength and work out stiffness but will evaluate.

## 2013-02-21 NOTE — Patient Instructions (Addendum)
Advised to f/u in 2- 2 1/2 weeks for cast removal and x-rays.

## 2013-02-21 NOTE — Assessment & Plan Note (Signed)
Radiographs show no additional displacement of nondisplaced distal radius styloid fracture.  Clinically healing as well.  New cast placed today then f/u in about 2 to 2 1/2 weeks for cast removal, repeat x-rays.  May need formal PT to regain strength and work out stiffness but will evaluate.

## 2013-03-02 ENCOUNTER — Ambulatory Visit (INDEPENDENT_AMBULATORY_CARE_PROVIDER_SITE_OTHER): Payer: BC Managed Care – PPO | Admitting: Family Medicine

## 2013-03-02 ENCOUNTER — Ambulatory Visit (HOSPITAL_BASED_OUTPATIENT_CLINIC_OR_DEPARTMENT_OTHER)
Admission: RE | Admit: 2013-03-02 | Discharge: 2013-03-02 | Disposition: A | Discharge status: Home / Self Care | Payer: BC Managed Care – PPO | Source: Ambulatory Visit | Attending: Family Medicine | Admitting: Family Medicine

## 2013-03-02 ENCOUNTER — Encounter: Payer: Self-pay | Admitting: Family Medicine

## 2013-03-02 VITALS — BP 126/69 | HR 67 | Ht 64.0 in | Wt 148.0 lb

## 2013-03-02 DIAGNOSIS — S52599A Other fractures of lower end of unspecified radius, initial encounter for closed fracture: Secondary | ICD-10-CM | POA: Insufficient documentation

## 2013-03-02 DIAGNOSIS — M25532 Pain in left wrist: Secondary | ICD-10-CM

## 2013-03-02 DIAGNOSIS — W19XXXA Unspecified fall, initial encounter: Secondary | ICD-10-CM | POA: Insufficient documentation

## 2013-03-02 DIAGNOSIS — Z5189 Encounter for other specified aftercare: Secondary | ICD-10-CM

## 2013-03-02 DIAGNOSIS — M25539 Pain in unspecified wrist: Secondary | ICD-10-CM

## 2013-03-02 DIAGNOSIS — S6992XD Unspecified injury of left wrist, hand and finger(s), subsequent encounter: Secondary | ICD-10-CM

## 2013-03-02 NOTE — Patient Instructions (Addendum)
Wear wrist brace at this point for next 4 weeks when going to be using arm/wrist a lot. Ice 15 minutes at a time 3-4 times a day as needed. Ibuprofen 600mg  three times a day with food for pain and inflammation. Start physical therapy for next 4 weeks to work on strengthening. Follow up with me in 4 weeks for reevaluation.

## 2013-03-03 ENCOUNTER — Encounter: Payer: Self-pay | Admitting: Family Medicine

## 2013-03-03 NOTE — Progress Notes (Signed)
Patient ID: Kathryn Jordan, female   DOB: 12-05-1955, 57 y.o.   MRN: [id number]  PCP: No primary provider on file.  Subjective:   HPI: Patient is a 57 y.o. female here for f/u left wrist injury.  6/16: Patient reports on 6/11 she was in the kitchen when she slipped on the wet floor. Not sure exactly how she injured left wrist - thinks she may have put hand out to brace her fall. Hit her head, landed on the left side. No LOC. Had some initial soreness in shoulder, elbow but pain now only in her left wrist. + bruising and swelling. X-rays in ED negative for fracture. Placed in a brace, given norco and also taking aleve and icing. No prior left wrist injuries. Is right handed.  6/27: Patient returns with continued left wrist pain. States she has improved some since last visit. Wearing wrist brace for support and protection regularly. Norco makes her too sleepy so she tried 1/2 of a tablet as needed. Finger movements bother her.  7/3: Patient returns for left cast to be placed.  7/17: Patient returns for f/u distal radius styloid fracture. She states not having any pain in cast currently - some soreness occasionally. No other complaints. Not taking anything for pain now.  7/30: Patient reports she's doing well with the cast. Still having pain up to 4/10 when picking things up occasionally. Not taking any medications for pain. No other complaints.  History reviewed. No pertinent past medical history.  Current Outpatient Prescriptions on File Prior to Visit  Medication Sig Dispense Refill  . estrogens, conjugated, (PREMARIN) 0.625 MG tablet Take 1 tablet (0.625 mg total) by mouth daily.  90 tablet  1  . FLUoxetine (PROZAC) 10 MG capsule Take 10 mg by mouth daily.      . progesterone (PROMETRIUM) 200 MG capsule Take 1 capsule (200 mg total) by mouth daily. 12 days/ month  36 capsule  1   No current facility-administered medications on file prior to visit.    Past Surgical  History  Procedure Laterality Date  . No past surgeries      Allergies  Allergen Reactions  . Meloxicam     History   Social History  . Marital Status: Married    Spouse Name: N/A    Number of Children: N/A  . Years of Education: N/A   Occupational History  . Not on file.   Social History Main Topics  . Smoking status: Never Smoker   . Smokeless tobacco: Never Used  . Alcohol Use: 0.5 oz/week    1 drink(s) per week  . Drug Use: No  . Sexually Active: No   Other Topics Concern  . Not on file   Social History Narrative  . No narrative on file    Family History  Problem Relation Age of Onset  . Diabetes Sister   . Heart attack Neg Hx   . Hyperlipidemia Neg Hx   . Hypertension Neg Hx   . Sudden death Neg Hx     BP 126/69  Pulse 67  Ht 5\' 4"  (1.626 m)  Wt 148 lb (67.132 kg)  BMI 25.39 kg/m2  Review of Systems: See HPI above.    Objective:  Physical Exam:  Gen: NAD  L wrist: Cast removed. No swelling dorsal and volar wrist.  No longer with bruising.  No other deformity.  No angulation, malrotation of digits. No TTP distal radius.  Minimal TTP at wrist joint dorsally.  No TTP  snuffbox, over scapholunate region.  No ulna, other wrist/hand TTP.  FROM digits and wrist. NVI distally.    Assessment & Plan:  1. Left wrist pain - Radiographs show no additional displacement of nondisplaced distal radius styloid fracture.  Again, clinical healing and lack of tenderness over fracture site have been good.  She is nearly 8 weeks out from this fracture.  Advised we move forward with PT/OT for ROM and strengthening to work out stiffness as I believe much of her residual pain is as a result of this and disuse.  If after 4 weeks she's still having pain that is not improving would consider either referral to hand surgery or repeat imaging for possible nonunion.  These types of fractures are at increased risk of this though typically when associated with other ligamentous  injuries or displacement of which she has neither.  F/u in 4 weeks.

## 2013-03-03 NOTE — Assessment & Plan Note (Signed)
Radiographs show no additional displacement of nondisplaced distal radius styloid fracture.  Again, clinical healing and lack of tenderness over fracture site have been good.  She is nearly 8 weeks out from this fracture.  Advised we move forward with PT/OT for ROM and strengthening to work out stiffness as I believe much of her residual pain is as a result of this and disuse.  If after 4 weeks she's still having pain that is not improving would consider either referral to hand surgery or repeat imaging for possible nonunion.  These types of fractures are at increased risk of this though typically when associated with other ligamentous injuries or displacement of which she has neither.  F/u in 4 weeks.

## 2013-03-09 ENCOUNTER — Encounter: Payer: Self-pay | Admitting: Obstetrics & Gynecology

## 2013-03-10 ENCOUNTER — Ambulatory Visit: Payer: BC Managed Care – PPO | Attending: Family Medicine | Admitting: Physical Therapy

## 2013-03-10 DIAGNOSIS — R609 Edema, unspecified: Secondary | ICD-10-CM | POA: Insufficient documentation

## 2013-03-10 DIAGNOSIS — M6281 Muscle weakness (generalized): Secondary | ICD-10-CM | POA: Insufficient documentation

## 2013-03-10 DIAGNOSIS — IMO0001 Reserved for inherently not codable concepts without codable children: Secondary | ICD-10-CM | POA: Insufficient documentation

## 2013-03-10 DIAGNOSIS — M25539 Pain in unspecified wrist: Secondary | ICD-10-CM | POA: Insufficient documentation

## 2013-03-16 ENCOUNTER — Ambulatory Visit: Payer: BC Managed Care – PPO | Admitting: Rehabilitation

## 2013-03-18 ENCOUNTER — Ambulatory Visit: Payer: BC Managed Care – PPO | Admitting: Physical Therapy

## 2013-03-21 ENCOUNTER — Ambulatory Visit: Payer: BC Managed Care – PPO | Admitting: Physical Therapy

## 2013-03-25 ENCOUNTER — Ambulatory Visit: Payer: BC Managed Care – PPO | Admitting: Rehabilitation

## 2013-03-28 ENCOUNTER — Ambulatory Visit: Payer: BC Managed Care – PPO | Admitting: Physical Therapy

## 2013-03-30 ENCOUNTER — Encounter: Payer: Self-pay | Admitting: Family Medicine

## 2013-03-30 ENCOUNTER — Ambulatory Visit (INDEPENDENT_AMBULATORY_CARE_PROVIDER_SITE_OTHER): Payer: BC Managed Care – PPO | Admitting: Family Medicine

## 2013-03-30 VITALS — BP 111/68 | HR 67 | Ht 64.0 in | Wt 145.0 lb

## 2013-03-30 DIAGNOSIS — S6992XD Unspecified injury of left wrist, hand and finger(s), subsequent encounter: Secondary | ICD-10-CM

## 2013-03-30 DIAGNOSIS — Z5189 Encounter for other specified aftercare: Secondary | ICD-10-CM

## 2013-03-31 ENCOUNTER — Encounter: Payer: BC Managed Care – PPO | Admitting: Physical Therapy

## 2013-03-31 ENCOUNTER — Encounter: Payer: Self-pay | Admitting: Family Medicine

## 2013-03-31 NOTE — Progress Notes (Signed)
Patient ID: Kathryn Jordan, female   DOB: [birth year], 57 y.o.   MRN: [id number]  PCP: No primary provider on file.  Subjective:   HPI: Patient is a 57 y.o. female here for f/u left wrist injury.  6/16: Patient reports on 6/11 she was in the kitchen when she slipped on the wet floor. Not sure exactly how she injured left wrist - thinks she may have put hand out to brace her fall. Hit her head, landed on the left side. No LOC. Had some initial soreness in shoulder, elbow but pain now only in her left wrist. + bruising and swelling. X-rays in ED negative for fracture. Placed in a brace, given norco and also taking aleve and icing. No prior left wrist injuries. Is right handed.  6/27: Patient returns with continued left wrist pain. States she has improved some since last visit. Wearing wrist brace for support and protection regularly. Norco makes her too sleepy so she tried 1/2 of a tablet as needed. Finger movements bother her.  7/3: Patient returns for left cast to be placed.  7/17: Patient returns for f/u distal radius styloid fracture. She states not having any pain in cast currently - some soreness occasionally. No other complaints. Not taking anything for pain now.  7/30: Patient reports she's doing well with the cast. Still having pain up to 4/10 when picking things up occasionally. Not taking any medications for pain. No other complaints.  8/27: Patient reports she feels better than last visit. Pain down to 3/10 at its worst. Bothers her mostly when picking up things. Wearing wrist brace and doing physical therapy. Doing home exercises and icing as well.  History reviewed. No pertinent past medical history.  Current Outpatient Prescriptions on File Prior to Visit  Medication Sig Dispense Refill  . estrogens, conjugated, (PREMARIN) 0.625 MG tablet Take 1 tablet (0.625 mg total) by mouth daily.  90 tablet  1  . FLUoxetine (PROZAC) 10 MG capsule Take 10 mg by  mouth daily.      . progesterone (PROMETRIUM) 200 MG capsule Take 1 capsule (200 mg total) by mouth daily. 12 days/ month  36 capsule  1   No current facility-administered medications on file prior to visit.    Past Surgical History  Procedure Laterality Date  . No past surgeries      Allergies  Allergen Reactions  . Meloxicam     History   Social History  . Marital Status: Married    Spouse Name: N/A    Number of Children: N/A  . Years of Education: N/A   Occupational History  . Not on file.   Social History Main Topics  . Smoking status: Never Smoker   . Smokeless tobacco: Never Used  . Alcohol Use: 0.5 oz/week    1 drink(s) per week  . Drug Use: No  . Sexual Activity: No   Other Topics Concern  . Not on file   Social History Narrative  . No narrative on file    Family History  Problem Relation Age of Onset  . Diabetes Sister   . Heart attack Neg Hx   . Hyperlipidemia Neg Hx   . Hypertension Neg Hx   . Sudden death Neg Hx     BP 111/68  Pulse 67  Ht 5\' 4"  (1.626 m)  Wt 145 lb (65.772 kg)  BMI 24.88 kg/m2  Review of Systems: See HPI above.    Objective:  Physical Exam:  Gen: NAD  L  wrist: No swelling dorsal and volar wrist.  No bruising.  No other deformity.  No angulation, malrotation of digits. No TTP distal radius.  Minimal TTP at wrist joint dorsally.  No TTP snuffbox, over scapholunate region.  No ulna, other wrist/hand TTP.  FROM digits with mild limitation wrist flexion and extension. NVI distally.    Assessment & Plan:  1. Left wrist pain - Again patient does not have tenderness at fracture site but has some pain subjectively at wrist.  She is improving albeit slowly.  About 12 weeks out from fracture.  Would continue with PT/OT and home exercises.  Bracing as needed.  We have previously discussed if not improving next step would be probable CT to assess degree of healing but she continues to have slow continuous improvement and no  tenderness at fracture site.  F/u in 6 weeks.

## 2013-03-31 NOTE — Assessment & Plan Note (Signed)
Again patient does not have tenderness at fracture site but has some pain subjectively at wrist.  She is improving albeit slowly.  About 12 weeks out from fracture.  Would continue with PT/OT and home exercises.  Bracing as needed.  We have previously discussed if not improving next step would be probable CT to assess degree of healing but she continues to have slow continuous improvement and no tenderness at fracture site.  F/u in 6 weeks.

## 2013-04-13 ENCOUNTER — Ambulatory Visit: Payer: BC Managed Care – PPO | Attending: Family Medicine | Admitting: Physical Therapy

## 2013-04-13 DIAGNOSIS — R609 Edema, unspecified: Secondary | ICD-10-CM | POA: Insufficient documentation

## 2013-04-13 DIAGNOSIS — M25539 Pain in unspecified wrist: Secondary | ICD-10-CM | POA: Insufficient documentation

## 2013-04-13 DIAGNOSIS — IMO0001 Reserved for inherently not codable concepts without codable children: Secondary | ICD-10-CM | POA: Insufficient documentation

## 2013-04-13 DIAGNOSIS — M6281 Muscle weakness (generalized): Secondary | ICD-10-CM | POA: Insufficient documentation

## 2013-04-15 ENCOUNTER — Ambulatory Visit: Payer: BC Managed Care – PPO | Admitting: Rehabilitation

## 2013-04-18 ENCOUNTER — Ambulatory Visit: Payer: BC Managed Care – PPO | Admitting: Rehabilitation

## 2013-04-21 ENCOUNTER — Ambulatory Visit: Payer: BC Managed Care – PPO | Admitting: Rehabilitation

## 2013-04-25 ENCOUNTER — Ambulatory Visit: Payer: BC Managed Care – PPO | Admitting: Physical Therapy

## 2013-04-28 ENCOUNTER — Ambulatory Visit: Payer: BC Managed Care – PPO | Admitting: Rehabilitation

## 2013-05-02 ENCOUNTER — Encounter: Payer: BC Managed Care – PPO | Admitting: Physical Therapy

## 2013-05-05 ENCOUNTER — Encounter: Payer: BC Managed Care – PPO | Admitting: Rehabilitation

## 2013-05-11 ENCOUNTER — Ambulatory Visit: Payer: BC Managed Care – PPO | Admitting: Family Medicine

## 2013-05-12 ENCOUNTER — Encounter: Payer: Self-pay | Admitting: Family Medicine

## 2013-05-12 ENCOUNTER — Ambulatory Visit (INDEPENDENT_AMBULATORY_CARE_PROVIDER_SITE_OTHER): Payer: BC Managed Care – PPO | Admitting: Family Medicine

## 2013-05-12 VITALS — BP 125/74 | HR 66 | Ht 64.0 in | Wt 148.0 lb

## 2013-05-12 DIAGNOSIS — S6992XD Unspecified injury of left wrist, hand and finger(s), subsequent encounter: Secondary | ICD-10-CM

## 2013-05-12 DIAGNOSIS — Z5189 Encounter for other specified aftercare: Secondary | ICD-10-CM

## 2013-05-16 ENCOUNTER — Encounter: Payer: Self-pay | Admitting: Family Medicine

## 2013-05-16 NOTE — Progress Notes (Signed)
Patient ID: DE LIBMAN, female   DOB: 1956-02-15, 57 y.o.   MRN: [id number]  PCP: No primary provider on file.  Subjective:   HPI: Patient is a 57 y.o. female here for f/u left wrist injury.  6/16: Patient reports on 6/11 she was in the kitchen when she slipped on the wet floor. Not sure exactly how she injured left wrist - thinks she may have put hand out to brace her fall. Hit her head, landed on the left side. No LOC. Had some initial soreness in shoulder, elbow but pain now only in her left wrist. + bruising and swelling. X-rays in ED negative for fracture. Placed in a brace, given norco and also taking aleve and icing. No prior left wrist injuries. Is right handed.  6/27: Patient returns with continued left wrist pain. States she has improved some since last visit. Wearing wrist brace for support and protection regularly. Norco makes her too sleepy so she tried 1/2 of a tablet as needed. Finger movements bother her.  7/3: Patient returns for left cast to be placed.  7/17: Patient returns for f/u distal radius styloid fracture. She states not having any pain in cast currently - some soreness occasionally. No other complaints. Not taking anything for pain now.  7/30: Patient reports she's doing well with the cast. Still having pain up to 4/10 when picking things up occasionally. Not taking any medications for pain. No other complaints.  8/27: Patient reports she feels better than last visit. Pain down to 3/10 at its worst. Bothers her mostly when picking up things. Wearing wrist brace and doing physical therapy. Doing home exercises and icing as well.  10/10: Patient reports she feels better still than last visit though slowly improving. Some pain at end of day if does a lot of work around the house. Would like to return to work or at least try. No longer using brace. Not taking any medications.  History reviewed. No pertinent past medical  history.  Current Outpatient Prescriptions on File Prior to Visit  Medication Sig Dispense Refill  . estrogens, conjugated, (PREMARIN) 0.625 MG tablet Take 1 tablet (0.625 mg total) by mouth daily.  90 tablet  1  . FLUoxetine (PROZAC) 10 MG capsule Take 10 mg by mouth daily.      . progesterone (PROMETRIUM) 200 MG capsule Take 1 capsule (200 mg total) by mouth daily. 12 days/ month  36 capsule  1   No current facility-administered medications on file prior to visit.    Past Surgical History  Procedure Laterality Date  . No past surgeries      Allergies  Allergen Reactions  . Meloxicam     History   Social History  . Marital Status: Married    Spouse Name: N/A    Number of Children: N/A  . Years of Education: N/A   Occupational History  . Not on file.   Social History Main Topics  . Smoking status: Never Smoker   . Smokeless tobacco: Never Used  . Alcohol Use: 0.5 oz/week    1 drink(s) per week  . Drug Use: No  . Sexual Activity: No   Other Topics Concern  . Not on file   Social History Narrative  . No narrative on file    Family History  Problem Relation Age of Onset  . Diabetes Sister   . Heart attack Neg Hx   . Hyperlipidemia Neg Hx   . Hypertension Neg Hx   . Sudden  death Neg Hx     BP 125/74  Pulse 66  Ht 5\' 4"  (1.626 m)  Wt 148 lb (67.132 kg)  BMI 25.39 kg/m2  Review of Systems: See HPI above.    Objective:  Physical Exam:  Gen: NAD  L wrist: No swelling dorsal and volar wrist.  No bruising.  No other deformity.  No angulation, malrotation of digits. No TTP distal radius.  Minimal TTP at wrist joint dorsally.  No TTP snuffbox, over scapholunate region.  No ulna, other wrist/hand TTP.  FROM digits with mild limitation wrist flexion. NVI distally.    Assessment & Plan:  1. Left wrist pain - 4 months out from chauffeurs fracture of wrist.  Again patient does not have tenderness at fracture site but has some pain subjectively at wrist.   Continues to improve slowly.  Will return to work.  Use brace as needed and continue home exercises until pain has resolved.  We have previously discussed if not improving next step would be probable CT to assess degree of healing but she continues to have slow continuous improvement and no tenderness at fracture site.  F/u prn.

## 2013-05-16 NOTE — Assessment & Plan Note (Signed)
4 months out from chauffeurs fracture of wrist.  Again patient does not have tenderness at fracture site but has some pain subjectively at wrist.  Continues to improve slowly.  Will return to work.  Use brace as needed and continue home exercises until pain has resolved.  We have previously discussed if not improving next step would be probable CT to assess degree of healing but she continues to have slow continuous improvement and no tenderness at fracture site.  F/u prn.

## 2013-05-17 ENCOUNTER — Ambulatory Visit: Payer: BC Managed Care – PPO | Attending: Podiatry | Admitting: Physical Therapy

## 2013-05-17 DIAGNOSIS — M6281 Muscle weakness (generalized): Secondary | ICD-10-CM | POA: Insufficient documentation

## 2013-05-17 DIAGNOSIS — M25579 Pain in unspecified ankle and joints of unspecified foot: Secondary | ICD-10-CM | POA: Insufficient documentation

## 2013-05-17 DIAGNOSIS — M25673 Stiffness of unspecified ankle, not elsewhere classified: Secondary | ICD-10-CM | POA: Insufficient documentation

## 2013-05-17 DIAGNOSIS — M25676 Stiffness of unspecified foot, not elsewhere classified: Secondary | ICD-10-CM | POA: Insufficient documentation

## 2013-05-17 DIAGNOSIS — IMO0001 Reserved for inherently not codable concepts without codable children: Secondary | ICD-10-CM | POA: Insufficient documentation

## 2013-05-17 DIAGNOSIS — R269 Unspecified abnormalities of gait and mobility: Secondary | ICD-10-CM | POA: Insufficient documentation

## 2013-05-24 ENCOUNTER — Ambulatory Visit: Payer: BC Managed Care – PPO | Admitting: Physical Therapy

## 2013-05-26 ENCOUNTER — Ambulatory Visit: Payer: BC Managed Care – PPO | Admitting: Physical Therapy

## 2013-05-26 ENCOUNTER — Encounter: Payer: Self-pay | Admitting: Family Medicine

## 2013-05-26 ENCOUNTER — Ambulatory Visit (INDEPENDENT_AMBULATORY_CARE_PROVIDER_SITE_OTHER): Payer: BC Managed Care – PPO | Admitting: Family Medicine

## 2013-05-26 VITALS — BP 138/77 | HR 82 | Temp 97.8°F | Ht 64.0 in | Wt 148.0 lb

## 2013-05-26 DIAGNOSIS — M722 Plantar fascial fibromatosis: Secondary | ICD-10-CM

## 2013-05-26 DIAGNOSIS — M25561 Pain in right knee: Secondary | ICD-10-CM

## 2013-05-26 DIAGNOSIS — M25551 Pain in right hip: Secondary | ICD-10-CM

## 2013-05-26 DIAGNOSIS — M25569 Pain in unspecified knee: Secondary | ICD-10-CM

## 2013-05-26 DIAGNOSIS — M25559 Pain in unspecified hip: Secondary | ICD-10-CM

## 2013-05-26 NOTE — Patient Instructions (Signed)
You have plantar fasciitis Aleve as noted below. Continue physical therapy and home exercises. Plantar fascia stretch for 20-30 seconds (do 3 of these) in morning Lowering/raise on a step exercises 3 x 10 once or twice a day - this is very important for long term recovery. Can add heel walks, toe walks forward and backward as well Ice heel for 15 minutes as needed. Avoid flat shoes/barefoot walking as much as possible. Arch straps have been shown to help with pain. Orthotics may be helpful. Physical therapy is also an option.  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 Start physical therapy for these and do home exercises also.  For your right knee take aleve 2 tabs twice a day with food Glucosamine sulfate 750mg  twice a day is a supplement that may help. Capsaicin topically up to four times a day may also help with pain. Cortisone injections are an option. Shoe inserts with good arch support may be helpful but no heel lifts. Heat or ice 15 minutes at a time 3-4 times a day as needed to help with pain.

## 2013-05-27 ENCOUNTER — Ambulatory Visit: Payer: BC Managed Care – PPO | Admitting: Rehabilitation

## 2013-05-31 ENCOUNTER — Ambulatory Visit: Payer: BC Managed Care – PPO | Admitting: Physical Therapy

## 2013-05-31 ENCOUNTER — Encounter: Payer: Self-pay | Admitting: Family Medicine

## 2013-05-31 DIAGNOSIS — M25551 Pain in right hip: Secondary | ICD-10-CM | POA: Insufficient documentation

## 2013-05-31 DIAGNOSIS — M722 Plantar fascial fibromatosis: Secondary | ICD-10-CM | POA: Insufficient documentation

## 2013-05-31 DIAGNOSIS — M25561 Pain in right knee: Secondary | ICD-10-CM | POA: Insufficient documentation

## 2013-05-31 NOTE — Assessment & Plan Note (Signed)
SI joint dysfunction/piriformis syndrome - add PT for this.  Home exercises, stretches reviewed also.  Likely related to use of heel lift that she does not need.

## 2013-05-31 NOTE — Assessment & Plan Note (Signed)
also likely related to use of heel lift that she does not need.  Possible she has some underlying arthritis and not just dynamic issue from the lift.  Discussed tylenol, nsaids, capsaicin, cortisone injections.

## 2013-05-31 NOTE — Progress Notes (Signed)
Patient ID: CORYNNE SCIBILIA, female   DOB: 08/09/1955, 57 y.o.   MRN: [id number]  PCP: No primary provider on file.  Subjective:   HPI: Patient is a 57 y.o. female here for right foot, knee, hip pain.  Patient reports for past 2-3 years she has had right plantar foot pain that became more consistent in February 2014. Was given custom orthotics by a podiatrist and told right leg shorter than left leg - heel lift given for right. Since that time however has started to develop woersening right medial knee pain, right hip pain. Being seen in PT following a wrist fracture and was told she does not have a leg length difference. Not doing any exercises or stretches yet for these areas. No numbness/tingling. No bowel/bladder dysfunction.  History reviewed. No pertinent past medical history.  Current Outpatient Prescriptions on File Prior to Visit  Medication Sig Dispense Refill  . estrogens, conjugated, (PREMARIN) 0.625 MG tablet Take 1 tablet (0.625 mg total) by mouth daily.  90 tablet  1  . FLUoxetine (PROZAC) 10 MG capsule Take 10 mg by mouth daily.      . progesterone (PROMETRIUM) 200 MG capsule Take 1 capsule (200 mg total) by mouth daily. 12 days/ month  36 capsule  1   No current facility-administered medications on file prior to visit.    Past Surgical History  Procedure Laterality Date  . No past surgeries      Allergies  Allergen Reactions  . Meloxicam     History   Social History  . Marital Status: Married    Spouse Name: N/A    Number of Children: N/A  . Years of Education: N/A   Occupational History  . Not on file.   Social History Main Topics  . Smoking status: Never Smoker   . Smokeless tobacco: Never Used  . Alcohol Use: 0.5 oz/week    1 drink(s) per week  . Drug Use: No  . Sexual Activity: No   Other Topics Concern  . Not on file   Social History Narrative  . No narrative on file    Family History  Problem Relation Age of Onset  . Diabetes  Sister   . Heart attack Neg Hx   . Hyperlipidemia Neg Hx   . Hypertension Neg Hx   . Sudden death Neg Hx     BP 138/77  Pulse 82  Temp(Src) 97.8 F (36.6 C) (Oral)  Ht 5\' 4"  (1.626 m)  Wt 148 lb (67.132 kg)  BMI 25.39 kg/m2  Review of Systems: See HPI above.    Objective:  Physical Exam:  Gen: NAD  Right foot/ankle: No gross deformity, swelling, ecchymoses FROM TTP plantar fascia at insertion on calcaneus. Negative ant drawer and talar tilt.   Negative syndesmotic compression. Thompsons test negative. NV intact distally.  R knee: No gross deformity, ecchymoses, swelling.  Mild medial joint line TTP. FROM. Negative ant/post drawers. Negative valgus/varus testing. Negative lachmanns. Negative mcmurrays, apleys, patellar apprehension, clarkes. NV intact distally.  Back/R hip: No gross deformity, scoliosis. TTP right SI joint, piriformis.  No midline or bony TTP. FROM. Strength LEs 5/5 all muscle groups except 4+/5 with hip abduction.   2+ MSRs in patellar and achilles tendons, equal bilaterally. Negative SLRs. Sensation intact to light touch bilaterally. Negative logroll bilateral hips Mild positive right fabers and piriformis stretches.    Leg lengths 80cm bilaterally, equal.  Assessment & Plan:  1. Plantar fasciitis - Shown home exercise program and stretches.  Arch binders.  Use orthotics that do not have a heel lift as she doesn't have a leg length difference (though could use heel lift on both sides).  Icing, tylenol/nsaids as needed.  PT for this, avoid flat shoes/barefoot walking.    2. SI joint dysfunction/piriformis syndrome - add PT for this.  Home exercises, stretches reviewed also.  Likely related to use of heel lift that she does not need.  3. Right knee pain - also likely related to use of heel lift that she does not need.  Possible she has some underlying arthritis and not just dynamic issue from the lift.  Discussed tylenol, nsaids, capsaicin,  cortisone injections.

## 2013-05-31 NOTE — Assessment & Plan Note (Signed)
Shown home exercise program and stretches.  Arch binders.  Use orthotics that do not have a heel lift as she doesn't have a leg length difference (though could use heel lift on both sides).  Icing, tylenol/nsaids as needed.  PT for this, avoid flat shoes/barefoot walking.

## 2013-06-03 ENCOUNTER — Ambulatory Visit: Payer: BC Managed Care – PPO | Admitting: Physical Therapy

## 2013-06-07 ENCOUNTER — Ambulatory Visit: Payer: BC Managed Care – PPO | Attending: Family Medicine | Admitting: Rehabilitation

## 2013-06-07 DIAGNOSIS — R609 Edema, unspecified: Secondary | ICD-10-CM | POA: Insufficient documentation

## 2013-06-07 DIAGNOSIS — M25539 Pain in unspecified wrist: Secondary | ICD-10-CM | POA: Insufficient documentation

## 2013-06-07 DIAGNOSIS — IMO0001 Reserved for inherently not codable concepts without codable children: Secondary | ICD-10-CM | POA: Insufficient documentation

## 2013-06-07 DIAGNOSIS — M6281 Muscle weakness (generalized): Secondary | ICD-10-CM | POA: Insufficient documentation

## 2013-06-10 ENCOUNTER — Ambulatory Visit: Payer: BC Managed Care – PPO | Admitting: Rehabilitation

## 2013-08-01 ENCOUNTER — Other Ambulatory Visit: Payer: Self-pay

## 2013-08-01 DIAGNOSIS — Z1231 Encounter for screening mammogram for malignant neoplasm of breast: Secondary | ICD-10-CM

## 2013-08-19 ENCOUNTER — Other Ambulatory Visit: Payer: Self-pay | Admitting: Rheumatology

## 2013-08-19 DIAGNOSIS — M25561 Pain in right knee: Secondary | ICD-10-CM

## 2013-08-25 ENCOUNTER — Ambulatory Visit
Admission: RE | Admit: 2013-08-25 | Discharge: 2013-08-25 | Disposition: A | Discharge status: Home / Self Care | Payer: BC Managed Care – PPO | Source: Ambulatory Visit | Attending: Rheumatology | Admitting: Rheumatology

## 2013-08-25 DIAGNOSIS — M25561 Pain in right knee: Secondary | ICD-10-CM

## 2013-08-26 ENCOUNTER — Ambulatory Visit
Admission: RE | Admit: 2013-08-26 | Discharge: 2013-08-26 | Disposition: A | Discharge status: Home / Self Care | Payer: BC Managed Care – PPO | Source: Ambulatory Visit

## 2013-08-26 DIAGNOSIS — Z1231 Encounter for screening mammogram for malignant neoplasm of breast: Secondary | ICD-10-CM

## 2013-08-29 IMAGING — CR DG WRIST COMPLETE 3+V*L*
4 series · 4 of 4 positions shown · non-contrast
Comparison: 01/28/2013, MRI 02/01/2013

CLINICAL DATA: Wrist pain.  Recent fall

LEFT WRIST - COMPLETE 3+ VIEW

[x wrist pa left]
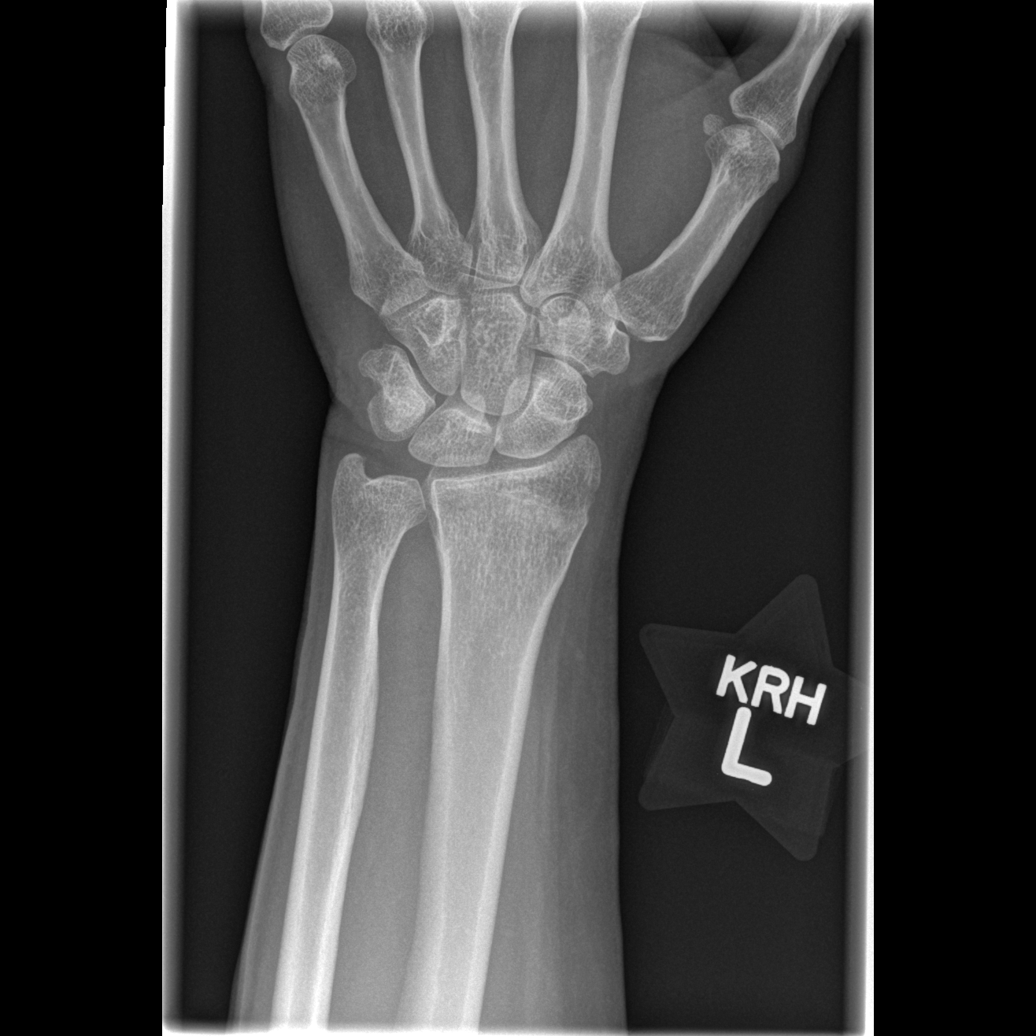

[x wrist obl left]
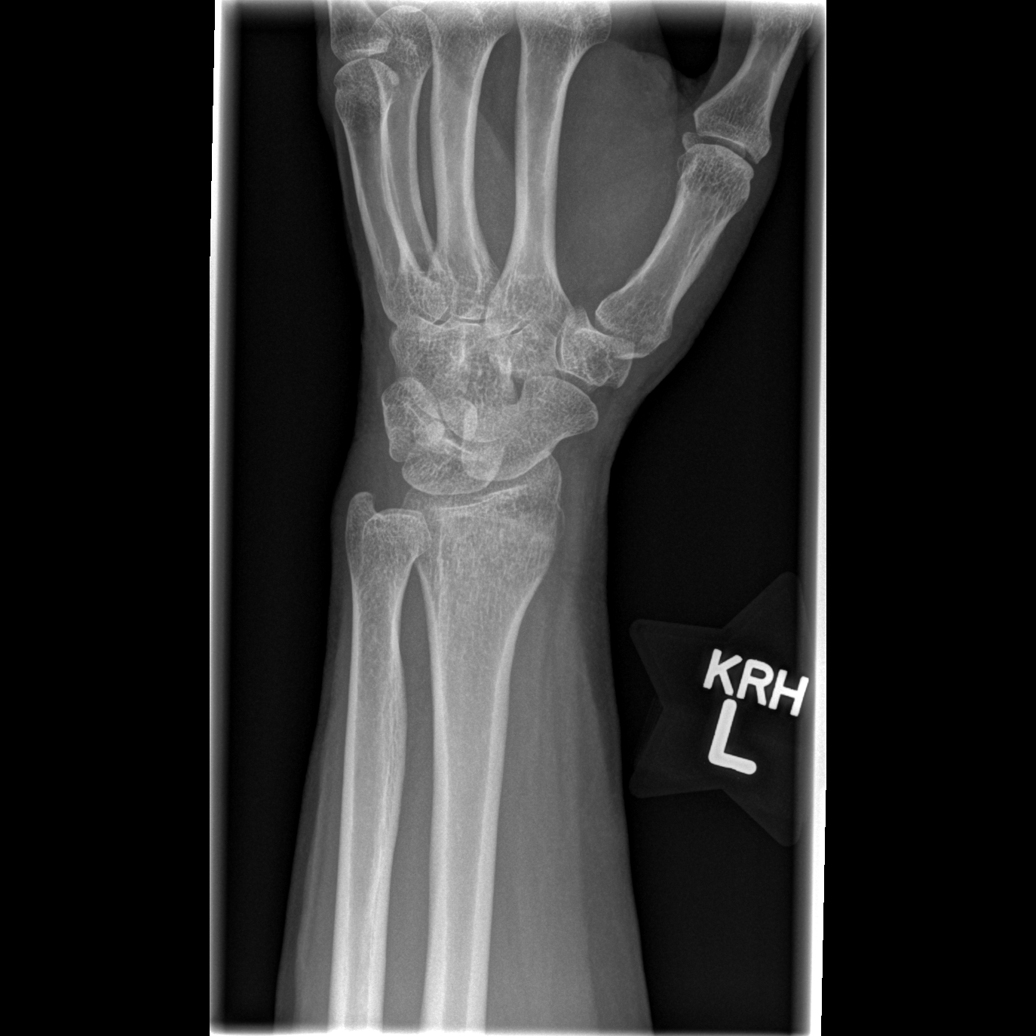

[x wrist lat left]
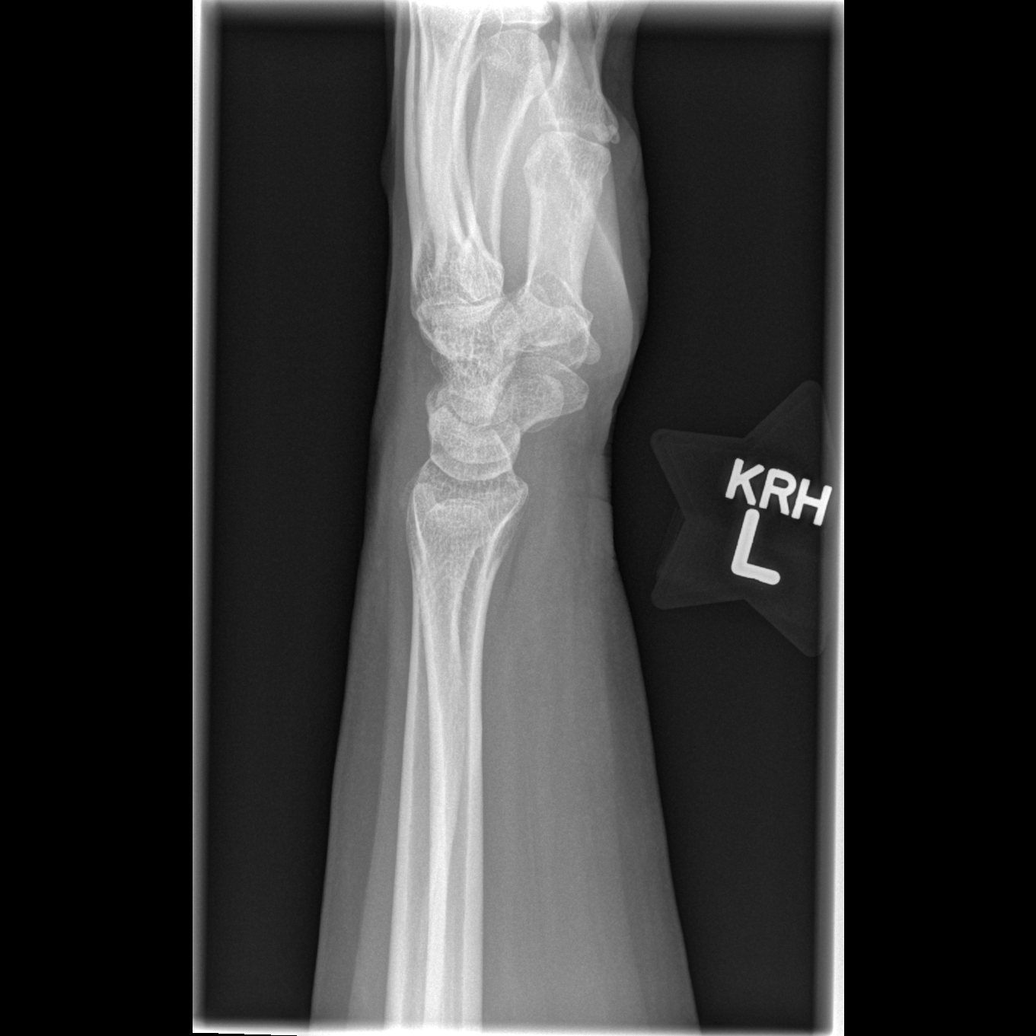

[x navicular]
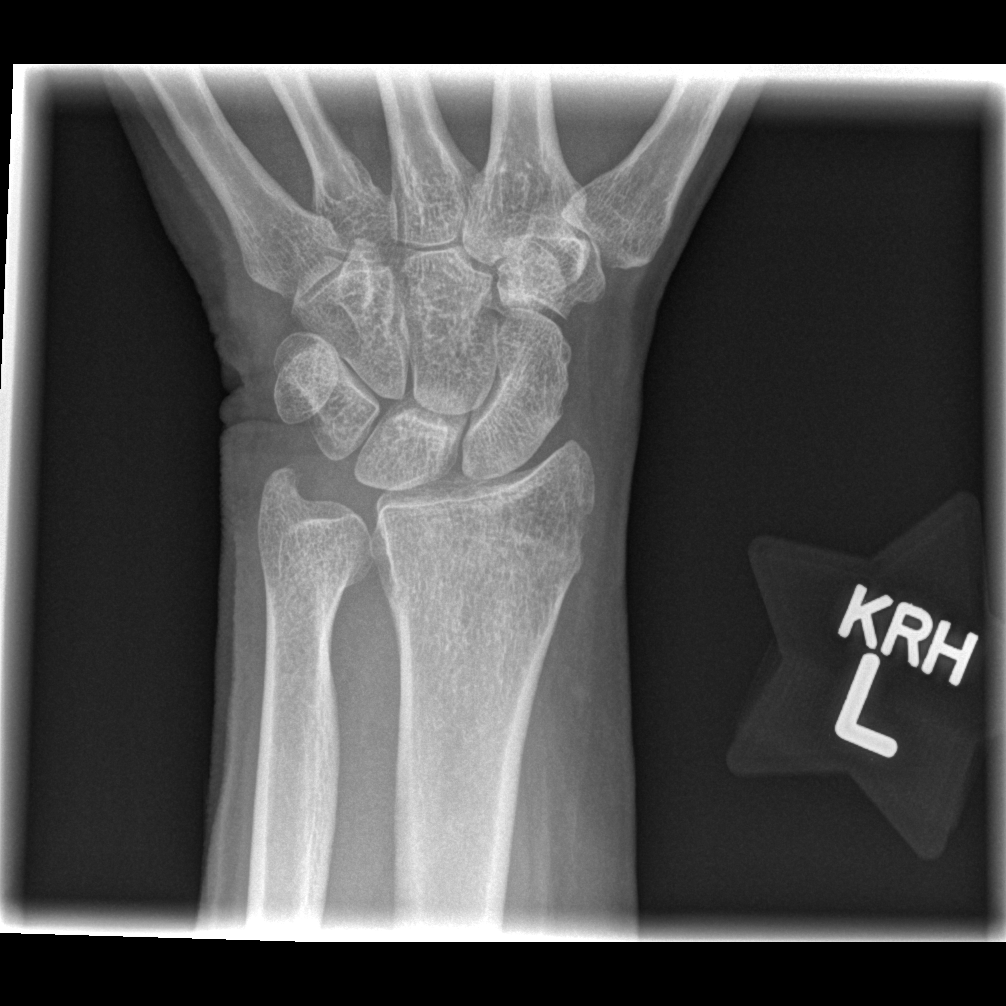

[4 of 4 positions shown; findings below may reference images not displayed]

FINDINGS: Compared with the prior x-rays, there is now a band of
lucency across the distal radius compatible with a nondisplaced
fracture, this is confirmed on MRI.  No fracture of the ulnar
styloid.  Radiocarpal joint is normal.  No carpal fracture.
IMPRESSION: Nondisplaced fracture distal radius and radial styloid.  No other
fracture.

## 2013-11-08 ENCOUNTER — Encounter: Payer: Self-pay | Admitting: Physician Assistant

## 2013-11-14 ENCOUNTER — Ambulatory Visit (INDEPENDENT_AMBULATORY_CARE_PROVIDER_SITE_OTHER): Payer: BC Managed Care – PPO | Admitting: Physician Assistant

## 2013-11-14 ENCOUNTER — Encounter: Payer: Self-pay | Admitting: Physician Assistant

## 2013-11-14 VITALS — BP 114/62 | HR 80 | Ht 64.0 in | Wt 153.6 lb

## 2013-11-14 DIAGNOSIS — R1011 Right upper quadrant pain: Secondary | ICD-10-CM

## 2013-11-14 DIAGNOSIS — R1013 Epigastric pain: Secondary | ICD-10-CM

## 2013-11-14 MED ORDER — PANTOPRAZOLE SODIUM 40 MG PO TBEC: 40.0000 mg | DELAYED_RELEASE_TABLET | Freq: Every day | ORAL | Status: DC

## 2013-11-14 NOTE — Patient Instructions (Addendum)
Stop Voltaren tablets. Take Tylenol You have been scheduled for an endoscopy with propofol. Please follow written instructions given to you at your visit today. If you use inhalers (even only as needed), please bring them with you on the day of your procedure. Your physician has requested that you go to www.startemmi.com and enter the access code given to you at your visit today. This web site gives a general overview about your procedure. However, you should still follow specific instructions given to you by our office regarding your preparation for the procedure.  We sent a prescription to [facility] for protonix 40 mg. Take 1 tab twice daily for 14 days then go to once daily.

## 2013-11-14 NOTE — Progress Notes (Addendum)
Subjective:    Patient ID: Kathryn Jordan, female    DOB: Feb 07, 1956, 58 y.o.   MRN: [id number]  HPI Kathryn Jordan is a pleasant 58 year old Hispanic female, and new to GI today. She is referred by her primary care physician at [facility]. She is referred for evaluation of epigastric and right upper quadrant pain. Patient did see Dr. Amedeo Plenty in 2009 also complaining at that time a right upper quadrant pain and records show that she had an upper abdominal ultrasound at that time which was normal and a CCK HIDA scan which showed a normal ejection fraction of 79%. She states she did not have endoscopic evaluation at that time. She does recall having a colonoscopy about 8 years ago and was told it was normal but cannot tell me where she had it done. She states that she has had symptoms off and on over the past several months but more persistent pain over the past month. She says she will have upper abdominal discomfort for 3-4 days and a rolling in may go for a few days without any symptoms. She says the pain keeps coming back which is why she wanted to be referred. She says typically the pain is worse after eating and she also notices increased belching and burping. She has no complaints of dysphagia or odynophagia, denies any heartburn. Her appetite has been okay her weight has been stable. She states the pain is sharp at times and radiates around into her back. Her bowel movements have been normal no melena or hematochezia. She has been taking diclofenac twice a day over about the past 6 months for arthritic-type symptoms. She relates that she did have a recent ultrasound and a CT scan done both of which she was told were normal. We have requested records E. and she did have a CCK HIDA scan done in April of 2015 showing an EF of 52%, and an upper abdominal ultrasound which was read as normal. Visualized portions of the pancreas were unremarkable. CBC [place] met were unremarkable March 2015 and amylase was  normal.    Review of Systems  Constitutional: Negative.   Eyes: Negative.   Respiratory: Negative.   Gastrointestinal: Positive for nausea and abdominal pain.  Endocrine: Negative.   Genitourinary: Negative.   Musculoskeletal: Positive for arthralgias.  Skin: Negative.   Allergic/Immunologic: Negative.   Neurological: Negative.   Hematological: Negative.   Psychiatric/Behavioral: Negative.    Outpatient Prescriptions Prior to Visit  Medication Sig Dispense Refill  . FLUoxetine (PROZAC) 10 MG capsule Take 10 mg by mouth daily.      Marland Kitchen estrogens, conjugated, (PREMARIN) 0.625 MG tablet Take 1 tablet (0.625 mg total) by mouth daily.  90 tablet  1  . progesterone (PROMETRIUM) 200 MG capsule Take 1 capsule (200 mg total) by mouth daily. 12 days/ month  36 capsule  1   No facility-administered medications prior to visit.       Allergies  Allergen Reactions  . Meloxicam    Patient Active Problem List   Diagnosis Date Noted  . Plantar fasciitis 05/31/2013  . Right hip pain 05/31/2013  . Right knee pain 05/31/2013  . Left wrist injury 01/18/2013  . Benign microscopic hematuria 01/10/2013  . Menopausal hot flushes 01/10/2013   History  Substance Use Topics  . Smoking status: Never Smoker   . Smokeless tobacco: Never Used  . Alcohol Use: 0.5 oz/week    1 drink(s) per week     Comment: occasional  family history includes Diabetes in her sister; Heart attack in her mother; Liver disease in her father. There is no history of Hyperlipidemia, Hypertension, Sudden death, or Colon cancer.  Objective:   Physical Exam Hispanic female in no acute distress, accompanied by her husband. She speaks English fairly well blood pressure 114/62 pulse 80 height 5 foot 4 weight 153. HEENT ;nontraumatic normocephalic EOMI PERRLA sclera anicteric, Neck ;supple no JVD, Cardiovascular ;regular rate and rhythm with S1-S2 no murmur or gallop, Pulm;clear bilaterally, Abdomen; soft mildly tender in the  epigastrium and right upper quadrant there is no guarding or rebound no palpable mass or hepatosplenomegaly, bowel sounds are present, Rectal ;exam not done, Extremities; no clubbing cyanosis or edema skin warm and dry, Psych; mood and affect appropriate.        Assessment & Plan:   #85   58 year old female with recurrent epigastric and right upper quadrant pain. Recent ultrasound and CCK HIDA scan were unremarkable. I suspect she may have an NSAID-induced gastropathy or peptic ulcer disease.   Plan ; have advised patient to stop diclofenac , and avoid other NSAIDs. She may use Tylenol as needed for arthritic symptoms  Schedule for EGD with Dr. Hilarie Fredrickson. Procedure was discussed in detail with the patient and her husband and they are agreeable to proceed  Start Protonix 40 mg by mouth twice daily x2 weeks and then once daily thereafter  Further plans pending findings at EGD if this is completely negative she should have a CT scan of the abdomen and pelvis .  Addendum: Reviewed and agree with initial management. Jerene Bears, MD

## 2013-11-15 ENCOUNTER — Ambulatory Visit (AMBULATORY_SURGERY_CENTER): Payer: BC Managed Care – PPO | Admitting: Internal Medicine

## 2013-11-15 ENCOUNTER — Encounter: Payer: Self-pay | Admitting: Internal Medicine

## 2013-11-15 VITALS — BP 118/59 | HR 60 | Temp 97.4°F | Resp 16 | Ht 64.0 in | Wt 153.0 lb

## 2013-11-15 DIAGNOSIS — R1013 Epigastric pain: Secondary | ICD-10-CM

## 2013-11-15 MED ORDER — SODIUM CHLORIDE 0.9 % IV SOLN: 500.0000 mL | INTRAVENOUS | Status: DC

## 2013-11-15 NOTE — Op Note (Signed)
[facility]  [address]. [place], [address]   ENDOSCOPY PROCEDURE REPORT  PATIENT: Kathryn Jordan, Kathryn Jordan  MR#: [id number] BIRTHDATE: Feb 01, 1956 , 37  yrs. old GENDER: Female ENDOSCOPIST: Jerene Bears, MD PROCEDURE DATE:  11/15/2013 PROCEDURE:  EGD w/ biopsy for H.pylori ASA CLASS:     Class II INDICATIONS:  Epigastric pain.   abdominal pain in the upper right quadrant. MEDICATIONS: MAC sedation, administered by CRNA and propofol (Diprivan) 200mg  IV TOPICAL ANESTHETIC: none  DESCRIPTION OF PROCEDURE: After the risks benefits and alternatives of the procedure were thoroughly explained, informed consent was obtained.  The [id number] endoscope was introduced through the mouth and advanced to the second portion of the duodenum. Without limitations.  The instrument was slowly withdrawn as the mucosa was fully examined.    ESOPHAGUS: The mucosa of the esophagus appeared normal.   Z-line regular at 40 cm.  STOMACH: The mucosa of the stomach appeared normal.  Multiple biopsies from the gastric body, antrum, and incisura.  DUODENUM: Mild duodenal inflammation was found in the duodenal bulb. The duodenal mucosa showed no abnormalities in the 2nd part of the duodenum.  Retroflexed views revealed no abnormalities.     The scope was then withdrawn from the patient and the procedure completed.  COMPLICATIONS: There were no complications. ENDOSCOPIC IMPRESSION: 1.   The mucosa of the esophagus appeared normal 2.   The mucosa of the stomach appeared normal 3.   Duodenal inflammation was found in the duodenal bulb 4.   The duodenal mucosa showed no abnormalities in the 2nd part of the duodenum  RECOMMENDATIONS: 1.  Await biopsy results 2.  Continue twice daily PPI for 1 month 3.  Follow-up of helicobacter pylori status, treat if indicated 4.  If biopsy negative for H.  Pylori, then would recommend CT scan of the abd/pelvis for further evaluation of upper  abdominal pain  eSigned:  Jerene Bears, MD 11/15/2013 10:44 AM    CC:The Patient; [facility]

## 2013-11-15 NOTE — Progress Notes (Signed)
Called to room to assist during endoscopic procedure.  Patient ID and intended procedure confirmed with present staff. Received instructions for my participation in the procedure from the performing physician.  

## 2013-11-15 NOTE — Patient Instructions (Signed)
CONTINUE TO TAKE YOUR PPI, PROTONIX TWICE DAILY FOR ONE MONTH.   YOU HAD AN ENDOSCOPIC PROCEDURE TODAY AT [facility]: Refer to the procedure report that was given to you for any specific questions about what was found during the examination.  If the procedure report does not answer your questions, please call your gastroenterologist to clarify.  If you requested that your care partner not be given the details of your procedure findings, then the procedure report has been included in a sealed envelope for you to review at your convenience later.  YOU SHOULD EXPECT: Some feelings of bloating in the abdomen. Passage of more gas than usual.  Walking can help get rid of the air that was put into your GI tract during the procedure and reduce the bloating. If you had a lower endoscopy (such as a colonoscopy or flexible sigmoidoscopy) you may notice spotting of blood in your stool or on the toilet paper. If you underwent a bowel prep for your procedure, then you may not have a normal bowel movement for a few days.  DIET: Your first meal following the procedure should be a light meal and then it is ok to progress to your normal diet.  A half-sandwich or bowl of soup is an example of a good first meal.  Heavy or fried foods are harder to digest and may make you feel nauseous or bloated.  Likewise meals heavy in dairy and vegetables can cause extra gas to form and this can also increase the bloating.  Drink plenty of fluids but you should avoid alcoholic beverages for 24 hours.  ACTIVITY: Your care partner should take you home directly after the procedure.  You should plan to take it easy, moving slowly for the rest of the day.  You can resume normal activity the day after the procedure however you should NOT DRIVE or use heavy machinery for 24 hours (because of the sedation medicines used during the test).    SYMPTOMS TO REPORT IMMEDIATELY: A gastroenterologist can be reached at any hour.  During  normal business hours, 8:30 AM to 5:00 PM Monday through Friday, call [phone].  After hours and on weekends, please call the GI answering service at [phone] who will take a message and have the physician on call contact you.  Following upper endoscopy (EGD)  Vomiting of blood or coffee ground material  New chest pain or pain under the shoulder blades  Painful or persistently difficult swallowing  New shortness of breath  Fever of 100F or higher  Black, tarry-looking stools  FOLLOW UP: If any biopsies were taken you will be contacted by phone or by letter within the next 1-3 weeks.  Call your gastroenterologist if you have not heard about the biopsies in 3 weeks.  Our staff will call the home number listed on your records the next business day following your procedure to check on you and address any questions or concerns that you may have at that time regarding the information given to you following your procedure. This is a courtesy call and so if there is no answer at the home number and we have not heard from you through the emergency physician on call, we will assume that you have returned to your regular daily activities without incident.  SIGNATURES/CONFIDENTIALITY: You and/or your care partner have signed paperwork which will be entered into your electronic medical record.  These signatures attest to the fact that that the information above on your  After Visit Summary has been reviewed and is understood.  Full responsibility of the confidentiality of this discharge information lies with you and/or your care-partner.

## 2013-11-15 NOTE — Progress Notes (Signed)
A/ox3 pleased with MAC, report to Karen RN 

## 2013-11-16 ENCOUNTER — Telehealth: Payer: Self-pay

## 2013-11-16 NOTE — Telephone Encounter (Signed)
  Follow up Call-  Call back number 11/15/2013  Post procedure Call Back phone  # [phone]  Permission to leave phone message No     Patient questions:  Do you have a fever, pain , or abdominal swelling? no Pain Score  0 *  Have you tolerated food without any problems? yes  Have you been able to return to your normal activities? yes  Do you have any questions about your discharge instructions: Diet   no Medications  no Follow up visit  no  Do you have questions or concerns about your Care? no  Actions: * If pain score is 4 or above: No action needed, pain <4.

## 2013-11-22 ENCOUNTER — Encounter: Payer: Self-pay | Admitting: Internal Medicine

## 2014-08-04 HISTORY — PX: CATARACT EXTRACTION, BILATERAL: SHX1313

## 2014-08-31 ENCOUNTER — Other Ambulatory Visit: Payer: Self-pay

## 2014-09-27 ENCOUNTER — Encounter: Payer: Self-pay | Admitting: Certified Nurse Midwife

## 2014-09-27 ENCOUNTER — Ambulatory Visit (INDEPENDENT_AMBULATORY_CARE_PROVIDER_SITE_OTHER): Payer: BLUE CROSS/BLUE SHIELD | Admitting: Certified Nurse Midwife

## 2014-09-27 VITALS — BP 133/76 | HR 82 | Temp 98.4°F | Ht 64.0 in | Wt 156.0 lb

## 2014-09-27 DIAGNOSIS — D239 Other benign neoplasm of skin, unspecified: Secondary | ICD-10-CM

## 2014-09-27 DIAGNOSIS — D229 Melanocytic nevi, unspecified: Secondary | ICD-10-CM

## 2014-09-27 DIAGNOSIS — Z01419 Encounter for gynecological examination (general) (routine) without abnormal findings: Secondary | ICD-10-CM | POA: Diagnosis not present

## 2014-09-27 DIAGNOSIS — N758 Other diseases of Bartholin's gland: Secondary | ICD-10-CM

## 2014-09-27 MED ORDER — CLINDAMYCIN HCL 300 MG PO CAPS: 300.0000 mg | ORAL_CAPSULE | Freq: Three times a day (TID) | ORAL | Status: AC

## 2014-09-27 NOTE — Progress Notes (Signed)
Patient ID: Kathryn Jordan, female   DOB: 07/30/1956, 59 y.o.   MRN: [id number]    Subjective:     Kathryn Jordan is a 59 y.o. female here for a routine exam.  Current complaints: vaginal pain on left labia, multiple new nevi.  LMP > 6 years ago.  Stopped HRT about 14 months ago, symptoms are stable only an occasional hot flash.    Personal health questionnaire:  Is patient Ashkenazi Jewish, have a family history of breast and/or ovarian cancer: yes Is there a family history of uterine cancer diagnosed at age < 47, gastrointestinal cancer, urinary tract cancer, family member who is a Field seismologist syndrome-associated carrier: no Is the patient overweight and hypertensive, family history of diabetes, personal history of gestational diabetes, preeclampsia or PCOS: no Is patient over 105, have PCOS,  family history of premature CHD under age 21, diabetes, smoke, have hypertension or peripheral artery disease:  no At any time, has a partner hit, kicked or otherwise hurt or frightened you?: no Over the past 2 weeks, have you felt down, depressed or hopeless?: no Over the past 2 weeks, have you felt little interest or pleasure in doing things?:no   Gynecologic History No LMP recorded. Patient is postmenopausal. Contraception: post menopausal status Last Pap: 01/10/13. Results were: normal Last mammogram: 08/31/13. Results were: normal  Obstetric History G5P5  Past Medical History  Diagnosis Date  . GERD (gastroesophageal reflux disease)   . Anemia   . Anxiety   . Arthritis   . Depression   . Hyperlipidemia   . Hypertension     Past Surgical History  Procedure Laterality Date  . No past surgeries       Current outpatient prescriptions:  .  cyclobenzaprine (FLEXERIL) 10 MG tablet, Take 10 mg by mouth 3 (three) times daily as needed., Disp: , Rfl:  .  VOLTAREN 1 % GEL, , Disp: , Rfl:  .  AMBULATORY NON FORMULARY MEDICATION, Medication Name: Stress-J 340 mg-Take 1-2 tablets daily, Disp: , Rfl:  .   AMBULATORY NON FORMULARY MEDICATION, Medication Name: Womens Balance- Take 2 tablets by mouth daily, Disp: , Rfl:  .  AMBULATORY NON FORMULARY MEDICATION, Medication Name: Pregnenolone 50 mg-Take 2 tablet by mouth daily., Disp: , Rfl:  .  clindamycin (CLEOCIN) 300 MG capsule, Take 1 capsule (300 mg total) by mouth 3 (three) times daily., Disp: 30 capsule, Rfl: 0 .  diclofenac (VOLTAREN) 75 MG EC tablet, Take 75 mg by mouth 2 (two) times daily., Disp: , Rfl:  .  FLUoxetine (PROZAC) 10 MG capsule, Take 10 mg by mouth daily., Disp: , Rfl:  .  Multiple Vitamins-Minerals (MULTIVITAMIN WITH MINERALS) tablet, Take 1 tablet by mouth daily., Disp: , Rfl:  .  pantoprazole (PROTONIX) 40 MG tablet, Take 1 tablet (40 mg total) by mouth daily. (Patient not taking: Reported on 09/27/2014), Disp: 90 tablet, Rfl: 3 .  RHODIOLA ROSEA PO, Take by mouth. Take 1 tablet by mouth daily, Disp: , Rfl:  Allergies  Allergen Reactions  . Meloxicam     History  Substance Use Topics  . Smoking status: Never Smoker   . Smokeless tobacco: Never Used  . Alcohol Use: 0.5 oz/week    1 Standard drinks or equivalent per week     Comment: occasional    Family History  Problem Relation Age of Onset  . Diabetes Sister   . Heart attack Mother   . Hyperlipidemia Neg Hx   . Hypertension Neg Hx   .  Sudden death Neg Hx   . Colon cancer Neg Hx   . Esophageal cancer Neg Hx   . Stomach cancer Neg Hx   . Liver disease Father       Review of Systems  Constitutional: negative for fatigue and weight loss Respiratory: negative for cough and wheezing Cardiovascular: negative for chest pain, fatigue and palpitations Gastrointestinal: negative for abdominal pain and change in bowel habits Musculoskeletal: positive for myalgias, has tendonitis/bursitis at several joints Neurological: negative for gait problems and tremors Behavioral/Psych: negative for abusive relationship, depression Endocrine: negative for temperature  intolerance   Genitourinary:negative for abnormal menstrual periods, genital lesions, hot flashes, sexual problems and vaginal discharge Integument/breast: negative for breast lump, breast tenderness, nipple discharge and skin lesion(s)    Objective:       BP 133/76 mmHg  Pulse 82  Temp(Src) 98.4 F (36.9 C)  Ht 5\' 4"  (1.626 m)  Wt 70.761 kg (156 lb)  BMI 26.76 kg/m2 General:   alert  Skin:   no rash or abnormalities  Lungs:   clear to auscultation bilaterally  Heart:   regular rate and rhythm, S1, S2 normal, no murmur, click, rub or gallop  Breasts:   normal without suspicious masses, skin or nipple changes or axillary nodes  Abdomen:  normal findings: no organomegaly, soft, non-tender and no hernia  Pelvis:  External genitalia: normal general appearance Urinary system: urethral meatus normal and bladder without fullness, nontender Vaginal: normal without tenderness, induration or masses Cervix: normal appearance Adnexa: normal bimanual exam Uterus: anteverted and non-tender, normal size   Lab Review Urine pregnancy test Labs reviewed yes Radiologic studies reviewed yes   Assessment:    Healthy female exam.  Multiple Nevi Bartohlan's Cyst   Plan:    Education reviewed: low fat, low cholesterol diet, self breast exams, skin cancer screening and weight bearing exercise. Mammogram ordered. Follow up in: 1 year.   Meds ordered this encounter  Medications  . clindamycin (CLEOCIN) 300 MG capsule    Sig: Take 1 capsule (300 mg total) by mouth 3 (three) times daily.    Dispense:  30 capsule    Refill:  0   Orders Placed This Encounter  Procedures  . SureSwab, Vaginosis/Vaginitis Plus  . MM DIGITAL SCREENING BILATERAL    Standing Status: Future     Number of Occurrences:      Standing Expiration Date: 11/26/2015    Order Specific Question:  Reason for Exam (SYMPTOM  OR DIAGNOSIS REQUIRED)    Answer:  Screening    Order Specific Question:  Is the patient pregnant?     Answer:  No    Order Specific Question:  Preferred imaging location?    Answer:  [facility]  . RPR  . Hepatitis B surface antigen  . Hepatitis C antibody  . HIV antibody (with reflex)  . Ambulatory referral to Dermatology    Referral Priority:  Routine    Referral Type:  Consultation    Referral Reason:  Specialty Services Required    Requested Specialty:  Dermatology    Number of Visits Requested:  1    Possible management options include: referral for surgical biopsy for bartohalan's abscess if indicated.

## 2014-09-28 LAB — HEPATITIS B SURFACE ANTIGEN: Hepatitis B Surface Ag: NEGATIVE

## 2014-09-28 LAB — HIV ANTIBODY (ROUTINE TESTING W REFLEX): HIV 1&2 Ab, 4th Generation: NONREACTIVE

## 2014-09-28 LAB — HEPATITIS C ANTIBODY: HCV Ab: NEGATIVE

## 2014-09-29 LAB — PAP IG AND HPV HIGH-RISK: HPV DNA HIGH RISK: NOT DETECTED

## 2014-10-01 LAB — SURESWAB, VAGINOSIS/VAGINITIS PLUS
Atopobium vaginae: 6 Log (cells/mL)
C. ALBICANS, DNA: NOT DETECTED
C. PARAPSILOSIS, DNA: NOT DETECTED
C. glabrata, DNA: NOT DETECTED
C. trachomatis RNA, TMA: NOT DETECTED
C. tropicalis, DNA: NOT DETECTED
LACTOBACILLUS SPECIES: NOT DETECTED Log (cells/mL)
MEGASPHAERA SPECIES: NOT DETECTED Log (cells/mL)
N. GONORRHOEAE RNA, TMA: NOT DETECTED
T. vaginalis RNA, QL TMA: NOT DETECTED

## 2014-10-13 ENCOUNTER — Encounter: Payer: Self-pay | Admitting: Certified Nurse Midwife

## 2014-10-13 ENCOUNTER — Ambulatory Visit (INDEPENDENT_AMBULATORY_CARE_PROVIDER_SITE_OTHER): Payer: BLUE CROSS/BLUE SHIELD | Admitting: Certified Nurse Midwife

## 2014-10-13 VITALS — BP 136/67 | HR 90 | Temp 99.2°F | Ht 64.0 in | Wt 157.0 lb

## 2014-10-13 DIAGNOSIS — N758 Other diseases of Bartholin's gland: Secondary | ICD-10-CM

## 2014-10-13 MED ORDER — LIDOCAINE-HYDROCORTISONE ACE 2-2 % RE KIT: 1.0000 "application " | PACK | Freq: Two times a day (BID) | RECTAL | Status: AC

## 2014-10-13 MED ORDER — AMOXICILLIN-POT CLAVULANATE 875-125 MG PO TABS: 1.0000 | ORAL_TABLET | Freq: Two times a day (BID) | ORAL | Status: AC

## 2014-10-13 MED ORDER — BACIT-POLY-NEO HC 1 % EX OINT: 1.0000 "application " | TOPICAL_OINTMENT | Freq: Two times a day (BID) | CUTANEOUS | Status: AC

## 2014-10-13 NOTE — Progress Notes (Signed)
Patient ID: Kathryn Jordan, female   DOB: 07-19-1956, 59 y.o.   MRN: [id number]    Chief Complaint  Patient presents with  . Bartholin's Cyst    HPI Kathryn Jordan is a 59 y.o. female.  Patient c/o irritation and pain around the bartholin's gland area.  Has been doing sitz baths with Epson salts.  Competed course of Clindamycin from last exam.  No change in vaginal discharge.    HPI  Past Medical History  Diagnosis Date  . GERD (gastroesophageal reflux disease)   . Anemia   . Anxiety   . Arthritis   . Depression   . Hyperlipidemia   . Hypertension     Past Surgical History  Procedure Laterality Date  . No past surgeries      Family History  Problem Relation Age of Onset  . Diabetes Sister   . Heart attack Mother   . Hyperlipidemia Neg Hx   . Hypertension Neg Hx   . Sudden death Neg Hx   . Colon cancer Neg Hx   . Esophageal cancer Neg Hx   . Stomach cancer Neg Hx   . Liver disease Father     Social History History  Substance Use Topics  . Smoking status: Never Smoker   . Smokeless tobacco: Never Used  . Alcohol Use: 0.6 oz/week    1 Standard drinks or equivalent per week     Comment: occasional    Allergies  Allergen Reactions  . Meloxicam     Current Outpatient Prescriptions  Medication Sig Dispense Refill  . AMBULATORY NON FORMULARY MEDICATION Medication Name: Womens Balance- Take 2 tablets by mouth daily    . Multiple Vitamins-Minerals (MULTIVITAMIN WITH MINERALS) tablet Take 1 tablet by mouth daily.    . VOLTAREN 1 % GEL     . AMBULATORY NON FORMULARY MEDICATION Medication Name: Stress-J 340 mg-Take 1-2 tablets daily    . AMBULATORY NON FORMULARY MEDICATION Medication Name: Pregnenolone 50 mg-Take 2 tablet by mouth daily.    Marland Kitchen amoxicillin-clavulanate (AUGMENTIN) 875-125 MG per tablet Take 1 tablet by mouth 2 (two) times daily. 30 tablet 0  . bacitracin-neomycin-polymyxin-hydrocortisone (CORTISPORIN) 1 % ointment Apply 1 application topically 2 (two)  times daily. 15 g 2  . cyclobenzaprine (FLEXERIL) 10 MG tablet Take 10 mg by mouth 3 (three) times daily as needed.    . diclofenac (VOLTAREN) 75 MG EC tablet Take 75 mg by mouth 2 (two) times daily.    Marland Kitchen FLUoxetine (PROZAC) 10 MG capsule Take 10 mg by mouth daily.    . Lidocaine-Hydrocortisone Ace 2-2 % KIT Place 1 application rectally 2 (two) times daily. 1 each 2  . pantoprazole (PROTONIX) 40 MG tablet Take 1 tablet (40 mg total) by mouth daily. (Patient not taking: Reported on 09/27/2014) 90 tablet 3  . RHODIOLA ROSEA PO Take by mouth. Take 1 tablet by mouth daily     No current facility-administered medications for this visit.    Review of Systems Review of Systems Constitutional: negative for fatigue and weight loss Respiratory: negative for cough and wheezing Cardiovascular: negative for chest pain, fatigue and palpitations Gastrointestinal: negative for abdominal pain and change in bowel habits Genitourinary:negative Integument/breast: negative for nipple discharge Musculoskeletal:negative for myalgias Neurological: negative for gait problems and tremors Behavioral/Psych: negative for abusive relationship, depression Endocrine: negative for temperature intolerance     Blood pressure 136/67, pulse 90, temperature 99.2 F (37.3 C), height '5\' 4"'  (1.626 m), weight 71.215 kg (157 lb).  Physical  Exam Physical Exam General:   alert  Skin:   no rash or abnormalities  Lungs:   clear to auscultation bilaterally  Heart:   regular rate and rhythm, S1, S2 normal, no murmur, click, rub or gallop  Breasts:   normal without suspicious masses, skin or nipple changes or axillary nodes  Abdomen:  normal findings: no organomegaly, soft, non-tender and no hernia  Pelvis:  External genitalia: normal general appearance Urinary system: urethral meatus normal and bladder without fullness, nontender Vaginal: erythema at Bartholin's gland 5 & 7 o'clock positions respectively with tenderness on  palpation, no palpable induration, abcess or masses noted.        Data Reviewed Past medical hx, labs, medications  Assessment     Bartholin's Gland Cellulitis     Plan    No orders of the defined types were placed in this encounter.   Meds ordered this encounter  Medications  . Lidocaine-Hydrocortisone Ace 2-2 % KIT    Sig: Place 1 application rectally 2 (two) times daily.    Dispense:  1 each    Refill:  2  . amoxicillin-clavulanate (AUGMENTIN) 875-125 MG per tablet    Sig: Take 1 tablet by mouth 2 (two) times daily.    Dispense:  30 tablet    Refill:  0  . bacitracin-neomycin-polymyxin-hydrocortisone (CORTISPORIN) 1 % ointment    Sig: Apply 1 application topically 2 (two) times daily.    Dispense:  15 g    Refill:  2   POC: reviewed with Dr. Jodi Mourning.  Follow up in three weeks.  Patient encouraged to continue Sitz baths with Epson Salts, ointments BID & to complete antibiotic course X 14 days.

## 2014-10-24 ENCOUNTER — Other Ambulatory Visit: Payer: Self-pay | Admitting: Certified Nurse Midwife

## 2014-10-24 DIAGNOSIS — Z1231 Encounter for screening mammogram for malignant neoplasm of breast: Secondary | ICD-10-CM

## 2014-11-01 ENCOUNTER — Ambulatory Visit
Admission: RE | Admit: 2014-11-01 | Discharge: 2014-11-01 | Disposition: A | Discharge status: Home / Self Care | Payer: BLUE CROSS/BLUE SHIELD | Source: Ambulatory Visit | Attending: Certified Nurse Midwife | Admitting: Certified Nurse Midwife

## 2014-11-01 DIAGNOSIS — Z1231 Encounter for screening mammogram for malignant neoplasm of breast: Secondary | ICD-10-CM

## 2014-11-03 ENCOUNTER — Encounter: Payer: Self-pay | Admitting: Certified Nurse Midwife

## 2014-11-03 ENCOUNTER — Ambulatory Visit (INDEPENDENT_AMBULATORY_CARE_PROVIDER_SITE_OTHER): Payer: BLUE CROSS/BLUE SHIELD | Admitting: Certified Nurse Midwife

## 2014-11-03 VITALS — BP 144/77 | HR 80 | Temp 98.7°F | Ht 64.0 in | Wt 155.0 lb

## 2014-11-03 DIAGNOSIS — N94819 Vulvodynia, unspecified: Secondary | ICD-10-CM | POA: Diagnosis not present

## 2014-11-03 MED ORDER — LIDOCAINE 5 % EX OINT: 1.0000 "application " | TOPICAL_OINTMENT | Freq: Every day | CUTANEOUS | Status: DC | PRN

## 2014-11-03 MED ORDER — ESTRADIOL 0.1 MG/GM VA CREA: TOPICAL_CREAM | VAGINAL | Status: DC

## 2014-11-03 NOTE — Progress Notes (Signed)
Patient ID: Kathryn Jordan, female   DOB: 08/29/1955, 59 y.o.   MRN: [id number]   Chief Complaint  Patient presents with  . Follow-up    symptoms still the same.     HPI Kathryn Jordan is a 59 y.o. female.  C/O vaginal burning and pain at the introitus to the vaginal opening around the bartholin's gland.   Is not sexually active.  Denies any pruritus.  HPI  Past Medical History  Diagnosis Date  . GERD (gastroesophageal reflux disease)   . Anemia   . Anxiety   . Arthritis   . Depression   . Hyperlipidemia   . Hypertension     Past Surgical History  Procedure Laterality Date  . No past surgeries      Family History  Problem Relation Age of Onset  . Diabetes Sister   . Heart attack Mother   . Hyperlipidemia Neg Hx   . Hypertension Neg Hx   . Sudden death Neg Hx   . Colon cancer Neg Hx   . Esophageal cancer Neg Hx   . Stomach cancer Neg Hx   . Liver disease Father     Social History History  Substance Use Topics  . Smoking status: Never Smoker   . Smokeless tobacco: Never Used  . Alcohol Use: 0.6 oz/week    1 Standard drinks or equivalent per week     Comment: occasional    Allergies  Allergen Reactions  . Meloxicam     Current Outpatient Prescriptions  Medication Sig Dispense Refill  . AMBULATORY NON FORMULARY MEDICATION Medication Name: Stress-J 340 mg-Take 1-2 tablets daily    . AMBULATORY NON FORMULARY MEDICATION Medication Name: Womens Balance- Take 2 tablets by mouth daily    . AMBULATORY NON FORMULARY MEDICATION Medication Name: Pregnenolone 50 mg-Take 2 tablet by mouth daily.    . bacitracin-neomycin-polymyxin-hydrocortisone (CORTISPORIN) 1 % ointment Apply 1 application topically 2 (two) times daily. 15 g 2  . cyclobenzaprine (FLEXERIL) 10 MG tablet Take 10 mg by mouth 3 (three) times daily as needed.    . diclofenac (VOLTAREN) 75 MG EC tablet Take 75 mg by mouth 2 (two) times daily.    Marland Kitchen FLUoxetine (PROZAC) 10 MG capsule Take 10 mg by mouth daily.     . Lidocaine-Hydrocortisone Ace 2-2 % KIT Place 1 application rectally 2 (two) times daily. 1 each 2  . Multiple Vitamins-Minerals (MULTIVITAMIN WITH MINERALS) tablet Take 1 tablet by mouth daily.    . pantoprazole (PROTONIX) 40 MG tablet Take 1 tablet (40 mg total) by mouth daily. 90 tablet 3  . RHODIOLA ROSEA PO Take by mouth. Take 1 tablet by mouth daily    . VOLTAREN 1 % GEL     . estradiol (ESTRACE VAGINAL) 0.1 MG/GM vaginal cream 1 soybean size drop each night. 42.5 g 12  . lidocaine (XYLOCAINE) 5 % ointment Apply 1 application topically daily as needed. 35 g 2   No current facility-administered medications for this visit.    Review of Systems Review of Systems Constitutional: negative for fatigue and weight loss Respiratory: negative for cough and wheezing Cardiovascular: negative for chest pain, fatigue and palpitations Gastrointestinal: negative for abdominal pain and change in bowel habits Genitourinary:negative Integument/breast: negative for nipple discharge Musculoskeletal:negative for myalgias Neurological: negative for gait problems and tremors Behavioral/Psych: negative for abusive relationship, depression Endocrine: negative for temperature intolerance     Blood pressure 144/77, pulse 80, temperature 98.7 F (37.1 C), height '5\' 4"'  (1.626 m), weight  70.308 kg (155 lb).  Physical Exam Physical Exam General:   alert  Skin:   no rash or abnormalities  Lungs:   clear to auscultation bilaterally  Heart:   regular rate and rhythm, S1, S2 normal, no murmur, click, rub or gallop  Breasts:   deferred  Abdomen:  normal findings: no organomegaly, soft, non-tender and no hernia  Pelvis:  External genitalia: normal general appearance Urinary system: urethral meatus normal and bladder without fullness, nontender Vaginal: no induration or masses.  Slight tenderness to introitus, no erythema or inflammation of Bartholin's gland.   Cervix: deferred Adnexa: deferred Uterus:  deferred    90% of 15 min visit spent on counseling and coordination of care.   Data Reviewed Previous medical hx, labs, meds  Assessment     Vulvudynia     Plan    No orders of the defined types were placed in this encounter.   Meds ordered this encounter  Medications  . estradiol (ESTRACE VAGINAL) 0.1 MG/GM vaginal cream    Sig: 1 soybean size drop each night.    Dispense:  42.5 g    Refill:  12  . lidocaine (XYLOCAINE) 5 % ointment    Sig: Apply 1 application topically daily as needed.    Dispense:  35 g    Refill:  2     Possible management options include: Consult with Dr. Jodi Mourning for chronic valvular/vestibular pain Follow up as needed.

## 2014-11-15 ENCOUNTER — Ambulatory Visit (INDEPENDENT_AMBULATORY_CARE_PROVIDER_SITE_OTHER): Payer: BLUE CROSS/BLUE SHIELD | Admitting: Gynecology

## 2014-11-15 ENCOUNTER — Encounter: Payer: Self-pay | Admitting: Gynecology

## 2014-11-15 VITALS — BP 142/88 | Ht 64.25 in | Wt 154.0 lb

## 2014-11-15 DIAGNOSIS — Z780 Asymptomatic menopausal state: Secondary | ICD-10-CM

## 2014-11-15 DIAGNOSIS — N952 Postmenopausal atrophic vaginitis: Secondary | ICD-10-CM

## 2014-11-15 MED ORDER — NONFORMULARY OR COMPOUNDED ITEM: Status: DC

## 2014-11-15 NOTE — Progress Notes (Signed)
   The patient is a 59 year old who has not been seen in the office in many years and has been followed by another provider. 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 It appears that she has had this recurred several times and has been open and drain as well. She had seen another gynecologist last year and started her on Estrace vaginal cream which she only took for a few days and did not continue the recommendation. She stated that her Pap smear and mammogram this year was normal and her colonoscopy was less than 7 years ago and was normal. She has not had a bone density study.  Exam: Blood pressure 142/88 weight 154 pounds  BMI 26.23  Exam: Bartholin urethra Skene was within normal limits no evidence of Bartholin duct cyst noted. 2 scars were noted over previous incision and drainage sites of her left Bartholin duct area. No swelling or erythema noted. Nontender during exam. Vagina: No lesions or discharge Cervix: No lesions or discharge Uterus: Anteverted normal size shape and consistency Adnexa: No palpable mass or tenderness Rectal exam not done  Assessment/plan: Menopausal patient with vaginal atrophy contributing to irritation dryness of the vulva. I recommended that she continue the Estrace vaginal cream twice a week. Patient will be scheduled to undergo a screening bone density study here in the office in the next few weeks. Patient was reassured that she has no recurrence of her Bartholin duct cyst.

## 2014-11-15 NOTE — Patient Instructions (Signed)
Densitometra sea  (Bone Densitometry) La densitometra sea es una radiografa especial que mide la densidad de los huesos y se [place] para predecir el riesgo de fracturas seas. Esta estudio se [place] para Actor contenido mineral y la densidad de los huesos para diagnosticar osteoporosis. La osteoporosis es la prdida de tejido seo que hace que el hueso se debilite. Generalmente ocurre en las mujeres que entran en la menopausia. Pero tambin pueden sufrirla los hombres y personas con otras enfermedades.  PREPARACIN PARA LA PRUEBA  No es necesaria la preparacin.  Linzie Collin EXAMINARSE?   Todas las [employer] de 33 aos.  Las mujeres posmenopusicas (51 a 23 aos) con factores de riesgo para osteoporosis.  Las personas que han sufrido fracturas previas realizando actividades normales.  Las personas de contextura corporal delgada (menos de 127 libras [63.5 kg] o con un ndice de masa corporal [IMC] de menos de 21).  Las personas que tengan un padre que haya sufrido una fractura de cadera o que tengan antecedentes de osteoporosis.  Los fumadores.  Las personas que sufren artritis reumatoidea.  Los que consumen alcohol en exceso (ms de [place]).  Las mujeres con menopausia temprana. CUNDO DEBE REALIZAR UN [place]?  Las guas actuales sugieren que se debe esperar por lo menos 2 aos antes de repetir una prueba de densidad sea, si la primera fue normal. Algunos estudios recientes indican que las mujeres con densidad sea normal pueden esperar unos aos antes de repetir un estudio de densitometra sea. Comente estos temas con su mdico.  HALLAZGOS NORMALES:   Normal: menos de una desviacin estndar por debajo de lo normal (superior a -1).  Osteopenia:  1 a 2,5 desviaciones estndar por debajo de lo normal (-1 a -2,5).  Osteoporosis: ms de 2,5 desviaciones estndar por debajo de lo normal (menos de -2,5). Los [employer] se  informan como una "puntuacin T" y Ardelia Mems "puntuacin Z". La puntuacin T es el nmero que compara la densidad sea con la densidad sea de las mujeres jvenes y sanas. La puntuacin Z es un nmero que compara la densidad sea con las puntuaciones de mujeres de la misma [place], gnero y International aid/development worker.  Los rangos para los resultados normales pueden variar entre diferentes laboratorios y hospitales. Consulte siempre con su mdico despus de Psychologist, counselling estudio para Personal assistant significado de los [place] y si los valores se consideran "dentro de los lmites normales".  SIGNIFICADO DEL ESTUDIO  El mdico leer los resultados y Teacher, English as a foreign language con usted la importancia y el significado de los [place], as como las opciones de tratamiento y la necesidad de pruebas adicionales, si fuera necesario.  OBTENCIN DE LOS RESULTADOS DE LAS PRUEBAS  Es su responsabilidad retirar el resultado del [place]. Consulte en el laboratorio cuando y cmo podr [employer].  Document Released: 04/14/2012 [facility] Patient Information 2015 Kay. This information is not intended to replace advice given to you by your health care provider. Make sure you discuss any questions you have with your health care provider.

## 2015-02-21 ENCOUNTER — Ambulatory Visit (INDEPENDENT_AMBULATORY_CARE_PROVIDER_SITE_OTHER): Payer: BLUE CROSS/BLUE SHIELD | Admitting: Family Medicine

## 2015-02-21 ENCOUNTER — Encounter: Payer: Self-pay | Admitting: Family Medicine

## 2015-02-21 VITALS — BP 130/78 | HR 66 | Ht 63.0 in | Wt 156.0 lb

## 2015-02-21 DIAGNOSIS — M5442 Lumbago with sciatica, left side: Secondary | ICD-10-CM | POA: Diagnosis not present

## 2015-02-21 MED ORDER — PREDNISONE 10 MG PO TABS: ORAL_TABLET | ORAL | Status: DC

## 2015-02-21 NOTE — Patient Instructions (Signed)
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 Take tylenol for baseline pain relief (1-2 extra strength tabs 3x/day) A prednisone dose pack is the best option for immediate relief and may be prescribed. Day after finishing prednisone start advil 3 tablets three times a day with food for pain and inflammation. Consider tramadol or norco as needed for severe pain (no driving on this medicine). Consider flexeril as needed for muscle spasms (no driving on this medicine if it makes you sleepy). Stay as active as possible. Physical therapy has been shown to be helpful as well - start this and do home exercises on days you don't go to therapy. Strengthening of low back muscles, abdominal musculature are key for long term pain relief. If not improving, will consider further imaging (MRI). Call me in a couple weeks if you're really struggling otherwise follow up with me in 5-6 weeks.

## 2015-02-23 DIAGNOSIS — M545 Low back pain, unspecified: Secondary | ICD-10-CM | POA: Insufficient documentation

## 2015-02-23 NOTE — Progress Notes (Signed)
PCP: No PCP Per Patient  Subjective:   HPI: Patient is a 59 y.o. female here for low back pain.  Patient reports pain started on 7/4 without known injury. Sometimes worse when she was lying in bed on vacation. Radiates into left leg. Tried icing, tylenol, advil. Doing some exercises. No numbness or tingling. No bowel/bladder dysfunction. Pain level 7/10.  Past Medical History  Diagnosis Date  . GERD (gastroesophageal reflux disease)   . Anemia   . Anxiety   . Arthritis   . Depression   . Hyperlipidemia   . Hypertension     Current Outpatient Prescriptions on File Prior to Visit  Medication Sig Dispense Refill  . AMBULATORY NON FORMULARY MEDICATION Medication Name: Stress-J 340 mg-Take 1-2 tablets daily    . AMBULATORY NON FORMULARY MEDICATION Medication Name: Womens Balance- Take 2 tablets by mouth daily    . AMBULATORY NON FORMULARY MEDICATION Medication Name: Pregnenolone 50 mg-Take 2 tablet by mouth daily.    Marland Kitchen estradiol (ESTRACE VAGINAL) 0.1 MG/GM vaginal cream 1 soybean size drop each night. (Patient not taking: Reported on 11/15/2014) 42.5 g 12  . FLUoxetine (PROZAC) 10 MG capsule Take 10 mg by mouth daily.    Marland Kitchen lidocaine (XYLOCAINE) 5 % ointment Apply 1 application topically daily as needed. 35 g 2  . Multiple Vitamins-Minerals (MULTIVITAMIN WITH MINERALS) tablet Take 1 tablet by mouth daily.    Marland Kitchen neomycin-polymyxin-hydrocortisone (CORTISPORIN) 0.5 % cream Apply topically 2 (two) times daily.    . NONFORMULARY OR COMPOUNDED ITEM Estradiol 0.02 % 54ml prefilled applicator Sig: apply twice a week 24 each 4  . pantoprazole (PROTONIX) 40 MG tablet Take 1 tablet (40 mg total) by mouth daily. 90 tablet 3  . RHODIOLA ROSEA PO Take by mouth. Take 1 tablet by mouth daily    . VOLTAREN 1 % GEL      No current facility-administered medications on file prior to visit.    Past Surgical History  Procedure Laterality Date  . No past surgeries      Allergies  Allergen  Reactions  . Meloxicam     History   Social History  . Marital Status: Married    Spouse Name: N/A  . Number of Children: N/A  . Years of Education: N/A   Occupational History  . Not on file.   Social History Main Topics  . Smoking status: Never Smoker   . Smokeless tobacco: Never Used  . Alcohol Use: 0.6 oz/week    1 Standard drinks or equivalent per week     Comment: occasional  . Drug Use: No  . Sexual Activity: Not Currently   Other Topics Concern  . Not on file   Social History Narrative    Family History  Problem Relation Age of Onset  . Diabetes Sister   . Heart attack Mother   . Hyperlipidemia Neg Hx   . Hypertension Neg Hx   . Sudden death Neg Hx   . Colon cancer Neg Hx   . Esophageal cancer Neg Hx   . Stomach cancer Neg Hx   . Liver disease Father     BP 130/78 mmHg  Pulse 66  Ht 5\' 3"  (1.6 m)  Wt 156 lb (70.761 kg)  BMI 27.64 kg/m2  Review of Systems: See HPI above.    Objective:  Physical Exam:  Gen: NAD  Back: No gross deformity, scoliosis. TTP Left SI joint and paraspinal region of lumbar spine.  No midline or bony TTP. FROM. Strength  LEs 5/5 all muscle groups.   2+ MSRs in patellar and achilles tendons, equal bilaterally. Negative SLRs. Sensation intact to light touch bilaterally. Negative logroll bilateral hips Positive fabers on left.  Negative right.  Negative piriformis.    Assessment & Plan:  1. Low back pain - 2/2 SI joint dysfunction though also describes possible mild radiculopathy.  Start with prednisone dose pack and transition to advil.  Start physical therapy and home exercises.  F/u in 5-6 weeks.

## 2015-02-23 NOTE — Assessment & Plan Note (Signed)
2/2 SI joint dysfunction though also describes possible mild radiculopathy.  Start with prednisone dose pack and transition to advil.  Start physical therapy and home exercises.  F/u in 5-6 weeks.

## 2015-03-01 ENCOUNTER — Telehealth: Payer: Self-pay | Admitting: Family Medicine

## 2015-03-01 MED ORDER — PREDNISONE 10 MG PO TABS: ORAL_TABLET | ORAL | Status: DC

## 2015-03-01 NOTE — Telephone Encounter (Signed)
If it helped but didn't help enough we can try a 12 day prednisone pack instead.  Let me know if she'd like to do this.  Is that [facility] pharmacy?

## 2015-03-01 NOTE — Addendum Note (Signed)
Addended by: Dene Gentry on: 03/01/2015 12:02 PM   Modules accepted: Orders

## 2015-03-01 NOTE — Telephone Encounter (Signed)
Spoke to patient and she wants to do the 12 day prednisone pack. Will pick up from [facility].

## 2015-05-11 DIAGNOSIS — F341 Dysthymic disorder: Secondary | ICD-10-CM | POA: Insufficient documentation

## 2015-11-02 ENCOUNTER — Encounter: Payer: Self-pay | Admitting: Family Medicine

## 2015-11-02 ENCOUNTER — Ambulatory Visit (INDEPENDENT_AMBULATORY_CARE_PROVIDER_SITE_OTHER): Payer: Commercial Managed Care - HMO | Admitting: Family Medicine

## 2015-11-02 VITALS — BP 126/61 | HR 74 | Ht 63.0 in | Wt 160.0 lb

## 2015-11-02 DIAGNOSIS — M25561 Pain in right knee: Secondary | ICD-10-CM

## 2015-11-02 DIAGNOSIS — M25562 Pain in left knee: Secondary | ICD-10-CM | POA: Diagnosis not present

## 2015-11-02 MED ORDER — METHYLPREDNISOLONE ACETATE 40 MG/ML IJ SUSP
40.0000 mg | Freq: Once | INTRAMUSCULAR | Status: AC
  Administered 2015-11-02: 40 mg via INTRA_ARTICULAR

## 2015-11-02 MED ORDER — DICLOFENAC SODIUM 75 MG PO TBEC: 75.0000 mg | DELAYED_RELEASE_TABLET | Freq: Two times a day (BID) | ORAL | Status: DC

## 2015-11-02 MED FILL — DICLOFENAC SOD EC 75 MG TAB: 75 | 30 days supply | Qty: 60 | Fill #0

## 2015-11-02 NOTE — Progress Notes (Signed)
PCP: No PCP Per Patient  Subjective:   HPI: Patient is a 60 y.o. female here for bilateral knee pain.  Patient reports she's had bilateral knee pain worsening past few months. Right knee is worse than left, sharp. Pain is 8/10 on right, 5/10 on left. + swelling. Worse by end of day when on feet a lot at work. Has been limping. Reports having some pain front of knee and just below medial knee also. 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 No skin changes, fever.  Past Medical History  Diagnosis Date  . GERD (gastroesophageal reflux disease)   . Anemia   . Anxiety   . Arthritis   . Depression   . Hyperlipidemia   . Hypertension     Current Outpatient Prescriptions on File Prior to Visit  Medication Sig Dispense Refill  . AMBULATORY NON FORMULARY MEDICATION Medication Name: Stress-J 340 mg-Take 1-2 tablets daily    . AMBULATORY NON FORMULARY MEDICATION Medication Name: Womens Balance- Take 2 tablets by mouth daily    . AMBULATORY NON FORMULARY MEDICATION Medication Name: Pregnenolone 50 mg-Take 2 tablet by mouth daily.    Marland Kitchen estradiol (ESTRACE VAGINAL) 0.1 MG/GM vaginal cream 1 soybean size drop each night. (Patient not taking: Reported on 11/15/2014) 42.5 g 12  . Estradiol POWD   3  . FLUoxetine (PROZAC) 10 MG capsule Take 10 mg by mouth daily.    Marland Kitchen lidocaine (XYLOCAINE) 5 % ointment Apply 1 application topically daily as needed. 35 g 2  . Multiple Vitamins-Minerals (MULTIVITAMIN WITH MINERALS) tablet Take 1 tablet by mouth daily.    . NONFORMULARY OR COMPOUNDED ITEM Estradiol 0.02 % 11ml prefilled applicator Sig: apply twice a week 24 each 4  . pantoprazole (PROTONIX) 40 MG tablet Take 1 tablet (40 mg total) by mouth daily. 90 tablet 3  . RHODIOLA ROSEA PO Take by mouth. Take 1 tablet by mouth daily     No current facility-administered medications on file prior to visit.    Past Surgical History  Procedure Laterality Date  . No past surgeries       Allergies  Allergen Reactions  . Meloxicam     Social History   Social History  . Marital Status: Married    Spouse Name: N/A  . Number of Children: N/A  . Years of Education: N/A   Occupational History  . Not on file.   Social History Main Topics  . Smoking status: Never Smoker   . Smokeless tobacco: Never Used  . Alcohol Use: 0.6 oz/week    1 Standard drinks or equivalent per week     Comment: occasional  . Drug Use: No  . Sexual Activity: Not Currently   Other Topics Concern  . Not on file   Social History Narrative    Family History  Problem Relation Age of Onset  . Diabetes Sister   . Heart attack Mother   . Hyperlipidemia Neg Hx   . Hypertension Neg Hx   . Sudden death Neg Hx   . Colon cancer Neg Hx   . Esophageal cancer Neg Hx   . Stomach cancer Neg Hx   . Liver disease Father     BP 126/61 mmHg  Pulse 74  Ht 5\' 3"  (1.6 m)  Wt 160 lb (72.576 kg)  BMI 28.35 kg/m2  Review of Systems: See HPI above.    Objective:  Physical Exam:  Gen: NAD, comfortable in exam room  Bilateral knees: No gross deformity,  ecchymoses, effusion. TTP post patellar facets > medial joint lines.  No other tenderness. FROM. Negative ant/post drawers. Negative valgus/varus testing. Negative lachmanns. Negative mcmurrays, apleys, patellar apprehension. NV intact distally.    Assessment & Plan:  1. Bilateral knee pain - consistent with arthritis primarily though explains getting some pes bursitis and patellar tendinitis as well.  Discussed tylenol, rx voltaren, glucosamine, topical medications.  Given right knee intraarticular injection.  Home exercises reviewed.  Ice/heat.  F/u in 5-6 weeks.  After informed written consent, patient was seated on exam table. Right knee was prepped with alcohol swab and utilizing anteromedial approach, patient's right knee was injected intraarticularly with 3:1 marcaine: depomedrol. Patient tolerated the procedure well without immediate  complications.

## 2015-11-02 NOTE — Patient Instructions (Addendum)
Your primary issue is arthritis though you have evidence of patellar tendinitis and pes bursitis also. These are the 4 different classes of medicine you can take for this: Take tylenol 500mg  1-2 tabs three times a day for pain. Voltaren 75mg  twice a day with food for pain and inflammation. Glucosamine sulfate 750mg  twice a day is a supplement that may help. Capsaicin, aspercreme, or biofreeze topically up to four times a day may also help with pain. Cortisone injections are an option - you were given this today in the right knee. If cortisone injections do not help, there are different types of shots that may help but they take longer to take effect. It's important that you continue to stay active. Straight leg raises, knee extensions 3 sets of 10 once a day (add ankle weight if these become too easy). Consider physical therapy to strengthen muscles around the joint that hurts to take pressure off of the joint itself - if you want to do this we can sent the referral in. Shoe inserts with good arch support may be helpful. Walker or cane if needed. Heat or ice 15 minutes at a time 3-4 times a day as needed to help with pain. Water aerobics and cycling with low resistance are the best two types of exercise for arthritis. Follow up with me in 5-6 weeks though call sooner if you want to try an injection in the left knee.

## 2015-11-05 DIAGNOSIS — M25562 Pain in left knee: Secondary | ICD-10-CM

## 2015-11-05 DIAGNOSIS — M25561 Pain in right knee: Secondary | ICD-10-CM | POA: Insufficient documentation

## 2015-11-05 NOTE — Assessment & Plan Note (Signed)
consistent with arthritis primarily though explains getting some pes bursitis and patellar tendinitis as well.  Discussed tylenol, rx voltaren, glucosamine, topical medications.  Given right knee intraarticular injection.  Home exercises reviewed.  Ice/heat.  F/u in 5-6 weeks.  After informed written consent, patient was seated on exam table. Right knee was prepped with alcohol swab and utilizing anteromedial approach, patient's right knee was injected intraarticularly with 3:1 marcaine: depomedrol. Patient tolerated the procedure well without immediate complications.

## 2015-11-13 ENCOUNTER — Other Ambulatory Visit: Payer: Self-pay

## 2015-11-13 DIAGNOSIS — Z1231 Encounter for screening mammogram for malignant neoplasm of breast: Secondary | ICD-10-CM

## 2015-11-21 ENCOUNTER — Other Ambulatory Visit: Payer: Self-pay | Admitting: *Deleted

## 2015-11-21 ENCOUNTER — Encounter: Payer: Self-pay | Admitting: Vascular Surgery

## 2015-11-21 DIAGNOSIS — I83813 Varicose veins of bilateral lower extremities with pain: Secondary | ICD-10-CM

## 2015-11-28 ENCOUNTER — Ambulatory Visit
Admission: RE | Admit: 2015-11-28 | Discharge: 2015-11-28 | Disposition: A | Discharge status: Home / Self Care | Payer: Commercial Managed Care - HMO | Source: Ambulatory Visit

## 2015-11-28 DIAGNOSIS — Z1231 Encounter for screening mammogram for malignant neoplasm of breast: Secondary | ICD-10-CM

## 2016-01-29 ENCOUNTER — Encounter: Payer: Self-pay | Admitting: Family Medicine

## 2016-01-29 ENCOUNTER — Ambulatory Visit (INDEPENDENT_AMBULATORY_CARE_PROVIDER_SITE_OTHER): Payer: Commercial Managed Care - HMO | Admitting: Family Medicine

## 2016-01-29 VITALS — BP 110/68 | HR 73 | Ht 64.0 in | Wt 158.0 lb

## 2016-01-29 DIAGNOSIS — M25561 Pain in right knee: Secondary | ICD-10-CM | POA: Diagnosis not present

## 2016-01-29 DIAGNOSIS — M25562 Pain in left knee: Secondary | ICD-10-CM | POA: Diagnosis not present

## 2016-01-29 MED ORDER — METHYLPREDNISOLONE ACETATE 40 MG/ML IJ SUSP
40.0000 mg | Freq: Once | INTRAMUSCULAR | Status: AC
  Administered 2016-01-29: 40 mg via INTRA_ARTICULAR

## 2016-01-29 NOTE — Patient Instructions (Signed)
Your pain is due to pes bursitis and arthritis of your knees. These are the 4 different classes of medicine you can take for this: Take tylenol 500mg  1-2 tabs three times a day for pain. Aleve 2 tabs twice a day with food for pain and inflammation. Glucosamine sulfate 750mg  twice a day is a supplement that may help. Capsaicin, aspercreme, or biofreeze topically up to four times a day may also help with pain. If you want a cortisone shot for the bursitis we can do this today or in the future. If cortisone injections do not help, there are different types of shots that may help but they take longer to take effect (gel shots in the knee joint). It's important that you continue to stay active. Straight leg raises, knee extensions 3 sets of 10 once a day (add ankle weight if these become too easy). Start physical therapy to strengthen muscles around the joint that hurts to take pressure off of the joint itself. Shoe inserts with good arch support may be helpful. Heat or ice 15 minutes at a time 3-4 times a day as needed to help with pain. Water aerobics and cycling with low resistance are the best two types of exercise for arthritis. Follow up with me in 5-6 weeks. Repeating your MRI is another consideration.

## 2016-01-31 NOTE — Progress Notes (Signed)
PCP: No PCP Per Patient  Subjective:   HPI: Patient is a 60 y.o. female here for bilateral knee pain.  3/31: Patient reports she's had bilateral knee pain worsening past few months. Right knee is worse than left, sharp. Pain is 8/10 on right, 5/10 on left. + swelling. Worse by end of day when on feet a lot at work. Has been limping. Reports having some pain front of knee and just below medial knee also. 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 No skin changes, fever.  6/27: Patient returns with bilateral knee pain for the past month. Only about 2-3 weeks of improvement with intraarticular injection last visit. Right knee worse than left. Radiographs done by rheumatology and reports she was told nothing wrong but arthritis. Pain in right knee 7/10, left 5/10 - both anteromedial, sharp. Worse with walking. Has tried sleeve, tylenol, aleve as well. No skin changes, fever.  Past Medical History  Diagnosis Date  . GERD (gastroesophageal reflux disease)   . Anemia   . Anxiety   . Arthritis   . Depression   . Hyperlipidemia   . Hypertension   . Varicose veins   . Right knee pain     Current Outpatient Prescriptions on File Prior to Visit  Medication Sig Dispense Refill  . AMBULATORY NON FORMULARY MEDICATION Medication Name: Stress-J 340 mg-Take 1-2 tablets daily    . AMBULATORY NON FORMULARY MEDICATION Medication Name: Womens Balance- Take 2 tablets by mouth daily    . AMBULATORY NON FORMULARY MEDICATION Medication Name: Pregnenolone 50 mg-Take 2 tablet by mouth daily.    . CORTISPORIN 1 % ointment     . diclofenac (VOLTAREN) 75 MG EC tablet Take 1 tablet (75 mg total) by mouth 2 (two) times daily. 60 tablet 1  . estradiol (ESTRACE VAGINAL) 0.1 MG/GM vaginal cream 1 soybean size drop each night. (Patient not taking: Reported on 11/15/2014) 42.5 g 12  . Estradiol POWD   3  . FLUoxetine (PROZAC) 10 MG capsule Take 10 mg by mouth daily.    Marland Kitchen  lidocaine (XYLOCAINE) 5 % ointment Apply 1 application topically daily as needed. 35 g 2  . Multiple Vitamins-Minerals (MULTIVITAMIN WITH MINERALS) tablet Take 1 tablet by mouth daily.    . NONFORMULARY OR COMPOUNDED ITEM Estradiol 0.02 % 44ml prefilled applicator Sig: apply twice a week 24 each 4  . pantoprazole (PROTONIX) 40 MG tablet Take 1 tablet (40 mg total) by mouth daily. 90 tablet 3  . RESTASIS 0.05 % ophthalmic emulsion     . RHODIOLA ROSEA PO Take by mouth. Take 1 tablet by mouth daily     No current facility-administered medications on file prior to visit.    Past Surgical History  Procedure Laterality Date  . No past surgeries      Allergies  Allergen Reactions  . Cyclobenzaprine   . Meloxicam     Social History   Social History  . Marital Status: Married    Spouse Name: N/A  . Number of Children: N/A  . Years of Education: N/A   Occupational History  . Not on file.   Social History Main Topics  . Smoking status: Never Smoker   . Smokeless tobacco: Never Used  . Alcohol Use: 0.6 oz/week    1 Standard drinks or equivalent per week     Comment: occasional  . Drug Use: No  . Sexual Activity: Not Currently   Other Topics Concern  . Not on file  Social History Narrative    Family History  Problem Relation Age of Onset  . Diabetes Sister   . Heart attack Mother   . Heart disease Mother   . Hyperlipidemia Neg Hx   . Hypertension Neg Hx   . Sudden death Neg Hx   . Colon cancer Neg Hx   . Esophageal cancer Neg Hx   . Stomach cancer Neg Hx   . Liver disease Father   . Diabetes Brother     BP 110/68 mmHg  Pulse 73  Ht 5\' 4"  (1.626 m)  Wt 158 lb (71.668 kg)  BMI 27.11 kg/m2  Review of Systems: See HPI above.    Objective:  Physical Exam:  Gen: NAD, comfortable in exam room  Bilateral knees: No gross deformity, ecchymoses, effusion. TTP R > L pes bursae, less medial joint lines and post patellar facets.  No other  tenderness. FROM. Negative ant/post drawers. Negative valgus/varus testing. Negative lachmanns. Negative mcmurrays, apleys, patellar apprehension. NV intact distally.    Assessment & Plan:  1. Bilateral knee pain - Pain more focused in pes bursae area now but also has arthritis.  Did not respond to injection as well as hoped last visit - only 2-3 weeks of relief.  Pes injection given today on the right.  Discussed tylenol, rx voltaren, glucosamine, topical medications.  Home exercises reviewed.  Ice/heat.  Start physical therapy as well.  F/u in 5-6 weeks.  Consider repeating MRI if not improving at follow up.  After informed written consent, patient was seated on exam table. Right knee overlying pes bursa area was prepped with alcohol swab and injected with 2:1 marcaine: depomedrol. Patient tolerated the procedure well without immediate complications.

## 2016-01-31 NOTE — Assessment & Plan Note (Signed)
Pain more focused in pes bursae area now but also has arthritis.  Did not respond to injection as well as hoped last visit - only 2-3 weeks of relief.  Pes injection given today on the right.  Discussed tylenol, rx voltaren, glucosamine, topical medications.  Home exercises reviewed.  Ice/heat.  Start physical therapy as well.  F/u in 5-6 weeks.  Consider repeating MRI if not improving at follow up.  After informed written consent, patient was seated on exam table. Right knee overlying pes bursa area was prepped with alcohol swab and injected with 2:1 marcaine: depomedrol. Patient tolerated the procedure well without immediate complications.

## 2016-02-08 ENCOUNTER — Encounter: Payer: Self-pay | Admitting: Vascular Surgery

## 2016-02-13 ENCOUNTER — Ambulatory Visit (HOSPITAL_COMMUNITY): Payer: Commercial Managed Care - HMO

## 2016-02-13 ENCOUNTER — Encounter: Payer: Commercial Managed Care - HMO | Admitting: Vascular Surgery

## 2016-02-19 ENCOUNTER — Encounter: Payer: Commercial Managed Care - HMO | Admitting: Vascular Surgery

## 2016-02-21 ENCOUNTER — Encounter: Payer: Self-pay | Admitting: Vascular Surgery

## 2016-02-26 ENCOUNTER — Ambulatory Visit (INDEPENDENT_AMBULATORY_CARE_PROVIDER_SITE_OTHER): Payer: Commercial Managed Care - HMO | Admitting: Vascular Surgery

## 2016-02-26 ENCOUNTER — Encounter: Payer: Self-pay | Admitting: Vascular Surgery

## 2016-02-26 VITALS — BP 128/63 | HR 75 | Temp 97.9°F | Resp 16 | Ht 63.5 in | Wt 155.0 lb

## 2016-02-26 DIAGNOSIS — I8393 Asymptomatic varicose veins of bilateral lower extremities: Secondary | ICD-10-CM

## 2016-02-26 NOTE — Progress Notes (Signed)
Subjective:     Patient ID: Kathryn Jordan, female   DOB: [birth year], 60 y.o.   MRN: [id number]  HPI this 60 year old female is evaluated for bilateral lower extremity discomfort and varicose veins. She was referred by Belinda Block Smothers. Patient has no history of DVT thrombophlebitis stasis ulcers or bleeding. She has minimal swelling as the day progresses. 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 She did have some type of treatment at [facility] date years ago but she's not certain exactly what. She did have some bulges that were treated which are now resolved. Her biggest complaint is increasing numbers of spider veins in both legs. She does have aching discomfort in both legs frequently during the day and night which extended throughout the length of both legs. She is able to ambulate a mile.  Past Medical History:  Diagnosis Date  . Anemia   . Anxiety   . Arthritis   . Depression   . GERD (gastroesophageal reflux disease)   . Hyperlipidemia   . Hypertension   . Right knee pain   . Varicose veins     Social History  Substance Use Topics  . Smoking status: Never Smoker  . Smokeless tobacco: Never Used  . Alcohol use 0.6 oz/week    1 Standard drinks or equivalent per week     Comment: occasional    Family History  Problem Relation Age of Onset  . Diabetes Sister   . Heart attack Mother   . Heart disease Mother   . Hyperlipidemia Neg Hx   . Hypertension Neg Hx   . Sudden death Neg Hx   . Colon cancer Neg Hx   . Esophageal cancer Neg Hx   . Stomach cancer Neg Hx   . Liver disease Father   . Diabetes Brother     Allergies  Allergen Reactions  . Cyclobenzaprine   . Meloxicam      Current Outpatient Prescriptions:  .  AMBULATORY NON FORMULARY MEDICATION, Medication Name: Womens Balance- Take 2 tablets by mouth daily, Disp: , Rfl:  .  cyclobenzaprine (FLEXERIL) 10 MG tablet, Take 10 mg by mouth 3 (three) times daily as needed for muscle spasms., Disp: ,  Rfl:  .  FLUoxetine (PROZAC) 10 MG capsule, Take 10 mg by mouth daily., Disp: , Rfl:  .  Multiple Vitamins-Minerals (MULTIVITAMIN WITH MINERALS) tablet, Take 1 tablet by mouth daily., Disp: , Rfl:  .  RESTASIS 0.05 % ophthalmic emulsion, , Disp: , Rfl:  .  AMBULATORY NON FORMULARY MEDICATION, Medication Name: Stress-J 340 mg-Take 1-2 tablets daily, Disp: , Rfl:  .  AMBULATORY NON FORMULARY MEDICATION, Medication Name: Pregnenolone 50 mg-Take 2 tablet by mouth daily., Disp: , Rfl:  .  CORTISPORIN 1 % ointment, , Disp: , Rfl:  .  diclofenac (VOLTAREN) 75 MG EC tablet, Take 1 tablet (75 mg total) by mouth 2 (two) times daily., Disp: 60 tablet, Rfl: 1 .  estradiol (ESTRACE VAGINAL) 0.1 MG/GM vaginal cream, 1 soybean size drop each night. (Patient not taking: Reported on 11/15/2014), Disp: 42.5 g, Rfl: 12 .  Estradiol POWD, , Disp: , Rfl: 3 .  lidocaine (XYLOCAINE) 5 % ointment, Apply 1 application topically daily as needed. (Patient not taking: Reported on 02/26/2016), Disp: 35 g, Rfl: 2 .  NONFORMULARY OR COMPOUNDED ITEM, Estradiol 0.02 % 42ml prefilled applicator Sig: apply twice a week, Disp: 24 each, Rfl: 4 .  pantoprazole (PROTONIX) 40 MG tablet, Take 1 tablet (40  mg total) by mouth daily. (Patient not taking: Reported on 02/26/2016), Disp: 90 tablet, Rfl: 3 .  RHODIOLA ROSEA PO, Take by mouth. Take 1 tablet by mouth daily, Disp: , Rfl:   Vitals:   02/26/16 1034  BP: 128/63  Pulse: 75  Resp: 16  Temp: 97.9 F (36.6 C)  SpO2: 97%  Weight: 155 lb (70.3 kg)  Height: 5' 3.5" (1.613 m)    Body mass index is 27.03 kg/m.          Review of Systems   denies chest pain, dyspnea on exertion, PND, orthopnea, hemoptysis. Denies lateralizing weakness, aphasia, amaurosis fugax, diplopia, blurred vision, syncope. Does have a history of anxiety, hypertension, hyperlipidemia, GERD. Other systems negative and complete review of systems Objective:   Physical Exam BP 128/63 (BP Location: Left Arm,  Patient Position: Sitting, Cuff Size: Normal)   Pulse 75   Temp 97.9 F (36.6 C)   Resp 16   Ht 5' 3.5" (1.613 m)   Wt 155 lb (70.3 kg)   SpO2 97%   BMI 27.03 kg/m     Gen.-alert and oriented x3 in no apparent distress HEENT normal for age Lungs no rhonchi or wheezing Cardiovascular regular rhythm no murmurs carotid pulses 3+ palpable no bruits audible Abdomen soft nontender no palpable masses Musculoskeletal free of  major deformities Skin clear -no rashes Neurologic normal Lower extremities 3+ femoral and 2+ dorsalis pedis pulses palpable bilaterally with no edema-diffuse spider veins in both lower extremities medial and lateral thigh up to the buttock area and medial and lateral calf. No hyperpigmentation or ulceration noted. No bulging varicosities noted.  Today I performed a bedside sono site ultrasound exam of both lower extremities. Both great saphenous veins are small in caliber with no reflux noted.     Assessment:     Bilateral spider veins with no evidence of significant gross reflux great saphenous veins on bedside independent sono site ultrasound exam Bilateral leg discomfort-etiology unknown-does not appear to be vascular in origin GERD History of anxiety Hypertension Hyperlipidemia    Plan:     Discussed with patient the fact that only treatment available would be foam sclerotherapy for the spider veins but no indication for laser ablation or stab phlebectomy Also unclear whether this would have any resolution of her symptomatology She will consider this and if she should decide to proceed be in touch with the

## 2016-04-21 ENCOUNTER — Telehealth: Payer: Self-pay | Admitting: Family Medicine

## 2016-04-24 NOTE — Telephone Encounter (Signed)
Order placed for MRI

## 2016-04-24 NOTE — Addendum Note (Signed)
Addended by: Sherrie George F on: 04/24/2016 12:28 PM   Modules accepted: Orders

## 2016-05-03 ENCOUNTER — Ambulatory Visit (HOSPITAL_BASED_OUTPATIENT_CLINIC_OR_DEPARTMENT_OTHER)
Admission: RE | Admit: 2016-05-03 | Discharge: 2016-05-03 | Disposition: A | Discharge status: Home / Self Care | Payer: Commercial Managed Care - HMO | Source: Ambulatory Visit | Attending: Family Medicine | Admitting: Family Medicine

## 2016-05-03 DIAGNOSIS — M2241 Chondromalacia patellae, right knee: Secondary | ICD-10-CM | POA: Diagnosis not present

## 2016-05-03 DIAGNOSIS — M25561 Pain in right knee: Secondary | ICD-10-CM | POA: Diagnosis present

## 2016-05-06 NOTE — Telephone Encounter (Signed)
Reviewed MRI and discussed with patient.  Only with evidence of Grade 4 chondromalacia, arthropathy posterior patella.  Reassured her.  Recommended physical therapy - she still has their number so will call (has not done this yet).  I would see her back 4-6 weeks after starting therapy.

## 2016-12-17 ENCOUNTER — Encounter: Payer: Self-pay | Admitting: Gynecology

## 2017-01-06 ENCOUNTER — Encounter: Payer: Self-pay | Admitting: Family

## 2017-03-04 ENCOUNTER — Other Ambulatory Visit: Payer: Self-pay | Admitting: Family

## 2017-03-04 DIAGNOSIS — Z1231 Encounter for screening mammogram for malignant neoplasm of breast: Secondary | ICD-10-CM

## 2017-03-10 ENCOUNTER — Ambulatory Visit
Admission: RE | Admit: 2017-03-10 | Discharge: 2017-03-10 | Disposition: A | Discharge status: Home / Self Care | Payer: Commercial Managed Care - HMO | Source: Ambulatory Visit | Attending: Family | Admitting: Family

## 2017-03-10 DIAGNOSIS — Z1231 Encounter for screening mammogram for malignant neoplasm of breast: Secondary | ICD-10-CM

## 2017-08-04 HISTORY — PX: ABDOMINOPLASTY/PANNICULECTOMY WITH LIPOSUCTION: SHX5577

## 2017-12-16 ENCOUNTER — Encounter: Payer: Self-pay | Admitting: Nurse Practitioner

## 2018-01-10 ENCOUNTER — Other Ambulatory Visit: Payer: Self-pay

## 2018-01-10 ENCOUNTER — Encounter (HOSPITAL_BASED_OUTPATIENT_CLINIC_OR_DEPARTMENT_OTHER): Payer: Self-pay | Admitting: Emergency Medicine

## 2018-01-10 ENCOUNTER — Emergency Department (HOSPITAL_BASED_OUTPATIENT_CLINIC_OR_DEPARTMENT_OTHER)
Admission: EM | Admit: 2018-01-10 | Discharge: 2018-01-10 | Disposition: A | Discharge status: Home / Self Care | Payer: Commercial Managed Care - HMO | Attending: Emergency Medicine | Admitting: Emergency Medicine

## 2018-01-10 DIAGNOSIS — Z79899 Other long term (current) drug therapy: Secondary | ICD-10-CM | POA: Insufficient documentation

## 2018-01-10 DIAGNOSIS — N3090 Cystitis, unspecified without hematuria: Secondary | ICD-10-CM

## 2018-01-10 DIAGNOSIS — I10 Essential (primary) hypertension: Secondary | ICD-10-CM | POA: Insufficient documentation

## 2018-01-10 LAB — URINALYSIS, ROUTINE W REFLEX MICROSCOPIC
Bilirubin Urine: NEGATIVE
GLUCOSE, UA: NEGATIVE mg/dL
Ketones, ur: NEGATIVE mg/dL
LEUKOCYTES UA: NEGATIVE
NITRITE: POSITIVE — AB
PH: 6.5 (ref 5.0–8.0)
Protein, ur: NEGATIVE mg/dL

## 2018-01-10 LAB — URINALYSIS, MICROSCOPIC (REFLEX)
Squamous Epithelial / LPF: NONE SEEN (ref 0–5)
WBC UA: NONE SEEN WBC/hpf (ref 0–5)

## 2018-01-10 MED ORDER — ONDANSETRON HCL 4 MG PO TABS: 4.0000 mg | ORAL_TABLET | Freq: Four times a day (QID) | ORAL | 0 refills | Status: DC

## 2018-01-10 MED ORDER — CEPHALEXIN 500 MG PO CAPS: 500.0000 mg | ORAL_CAPSULE | Freq: Three times a day (TID) | ORAL | 0 refills | Status: DC

## 2018-01-10 MED ORDER — PHENAZOPYRIDINE HCL 200 MG PO TABS: 200.0000 mg | ORAL_TABLET | Freq: Three times a day (TID) | ORAL | 0 refills | Status: DC

## 2018-01-10 NOTE — Discharge Instructions (Signed)
Urine analysis shows signs of infection. Please start taking the antibiotics prescribed for bladder infection. We have also prescribed you with Pyridium which will help relieve some of the spasming pain that you are suffering.  It might turn your urine orange -and that should not be worrisome.  Please return to the ER if your symptoms worsen; you have increased pain, fevers, chills, inability to keep any medications down, confusion. Otherwise see the outpatient doctor as requested.

## 2018-01-10 NOTE — ED Triage Notes (Signed)
Patient states that she is having pain to her lower abdominal area. The patient reports that she has had intermittent pain x 2 years. The patient reports that she has had intermittent UTI's  - denies any N/V

## 2018-01-10 NOTE — ED Provider Notes (Signed)
[facility] Provider Note   CSN: [id number] Arrival date & time: 01/10/18  1654     History   Chief Complaint Chief Complaint  Patient presents with  . Abdominal Pain    HPI Kathryn Jordan is a 62 y.o. female.  HPI  62 year old female with history of GERD, hypertension comes in with chief complaint of lower quadrant abdominal pain. Patient states that she has been having intermittent lower abdominal pain for the last few days.    She has suffered through similar symptoms over the past couple of years -but has not been given a clear diagnosis.  At the moment patient is having some discomfort with urination, however she has not noticed any blood in the urine or having urinary frequency.  Patient also denies any vaginal discharge or vaginal bleeding.  Review of system is negative for flank pain, vomiting, fevers.  ROS is + for nausea and chills.  Past Medical History:  Diagnosis Date  . Anemia   . Anxiety   . Arthritis   . Depression   . GERD (gastroesophageal reflux disease)   . Hyperlipidemia   . Hypertension   . Right knee pain   . Varicose veins     Patient Active Problem List   Diagnosis Date Noted  . Spider veins of both lower extremities 02/26/2016  . Bilateral knee pain 11/05/2015  . Dysthymia 05/11/2015  . Low back pain 02/23/2015  . 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 Plantar fasciitis 05/31/2013  . Right hip pain 05/31/2013  . Right knee pain 05/31/2013  . Left wrist injury 01/18/2013  . Benign microscopic hematuria 01/10/2013  . Menopausal hot flushes 01/10/2013    Past Surgical History:  Procedure Laterality Date  . NO PAST SURGERIES       OB History    Gravida  3   Para  3   Term      Preterm      AB      Living  3     SAB      TAB      Ectopic      Multiple      Live Births               Home Medications    Prior to Admission medications   Medication Sig Start Date End Date Taking? Authorizing  Provider  AMBULATORY NON FORMULARY MEDICATION Medication Name: Stress-J 340 mg-Take 1-2 tablets daily    [provider]  AMBULATORY NON FORMULARY MEDICATION Medication Name: Womens Balance- Take 2 tablets by mouth daily    [provider]  AMBULATORY NON FORMULARY MEDICATION Medication Name: Pregnenolone 50 mg-Take 2 tablet by mouth daily.    [provider]  cephALEXin (KEFLEX) 500 MG capsule Take 1 capsule (500 mg total) by mouth 3 (three) times daily. 01/10/18   Varney Biles, MD  CORTISPORIN 1 % ointment  10/08/15   [provider]  cyclobenzaprine (FLEXERIL) 10 MG tablet Take 10 mg by mouth 3 (three) times daily as needed for muscle spasms.    [provider]  diclofenac (VOLTAREN) 75 MG EC tablet Take 1 tablet (75 mg total) by mouth 2 (two) times daily. 11/02/15   Hudnall, Sharyn Lull, MD  estradiol (ESTRACE VAGINAL) 0.1 MG/GM vaginal cream 1 soybean size drop each night. Patient not taking: Reported on 11/15/2014 11/03/14   Morene Crocker, CNM  Estradiol POWD  12/26/14   [provider]  FLUoxetine (PROZAC) 10 MG  capsule Take 10 mg by mouth daily.    [provider]  lidocaine (XYLOCAINE) 5 % ointment Apply 1 application topically daily as needed. Patient not taking: Reported on 02/26/2016 11/03/14   Kandis Cocking A, CNM  Multiple Vitamins-Minerals (MULTIVITAMIN WITH MINERALS) tablet Take 1 tablet by mouth daily.    [provider]  NONFORMULARY OR COMPOUNDED ITEM Estradiol 0.02 % 70ml prefilled applicator Sig: apply twice a week 11/15/14   Terrance Mass, MD  pantoprazole (PROTONIX) 40 MG tablet Take 1 tablet (40 mg total) by mouth daily. Patient not taking: Reported on 02/26/2016 11/14/13   Esterwood, Amy S, PA-C  phenazopyridine (PYRIDIUM) 200 MG tablet Take 1 tablet (200 mg total) by mouth 3 (three) times daily. 01/10/18   Varney Biles, MD  RESTASIS 0.05 % ophthalmic emulsion  10/11/15   [provider]    RHODIOLA ROSEA PO Take by mouth. Take 1 tablet by mouth daily    [provider]    Family History Family History  Problem Relation Age of Onset  . Heart attack Mother   . Heart disease Mother   . Liver disease Father   . Diabetes Sister   . Diabetes Brother   . Hyperlipidemia Neg Hx   . Hypertension Neg Hx   . Sudden death Neg Hx   . Colon cancer Neg Hx   . Esophageal cancer Neg Hx   . Stomach cancer Neg Hx     Social History Social History   Tobacco Use  . Smoking status: Never Smoker  . Smokeless tobacco: Never Used  Substance Use Topics  . Alcohol use: Yes    Alcohol/week: 0.6 oz    Types: 1 Standard drinks or equivalent per week    Comment: occasional  . Drug use: No     Allergies   Cyclobenzaprine and Meloxicam   Review of Systems Review of Systems   Physical Exam Updated Vital Signs BP (!) 141/81 (BP Location: Left Arm)   Pulse 72   Temp 98.1 F (36.7 C) (Oral)   Resp 16   Ht 5\' 3"  (1.6 m)   Wt 68 kg (150 lb)   SpO2 98%   BMI 26.57 kg/m   Physical Exam   ED Treatments / Results  Labs (all labs ordered are listed, but only abnormal results are displayed) Labs Reviewed  URINALYSIS, ROUTINE W REFLEX MICROSCOPIC - Abnormal; Notable for the following components:      Result Value   Specific Gravity, Urine <1.005 (*)    Hgb urine dipstick MODERATE (*)    Nitrite POSITIVE (*)    All other components within normal limits  URINALYSIS, MICROSCOPIC (REFLEX) - Abnormal; Notable for the following components:   Bacteria, UA RARE (*)    All other components within normal limits  URINE CULTURE    EKG None  Radiology No results found.  Procedures Procedures (including critical care time)  Medications Ordered in ED Medications - No data to display   Initial Impression / Assessment and Plan / ED Course  I have reviewed the triage vital signs and the nursing notes.  Pertinent labs & imaging results that were available during my  care of the patient were reviewed by me and considered in my medical decision making (see chart for details).      62 y.o comes in with lower quadrant abdominal pain. She is having some urinary discomfort as well. No hx of abd surgery and no back pain. UA ordered - and it  is nitrite +. We will tx with pyridium and keflex. Strict ER return precautions have been discussed, and patient is agreeing with the plan and is comfortable with the workup done and the recommendations from the ER.   Final Clinical Impressions(s) / ED Diagnoses   Final diagnoses:  Cystitis    ED Discharge Orders        Ordered    cephALEXin (KEFLEX) 500 MG capsule  3 times daily     01/10/18 1830    phenazopyridine (PYRIDIUM) 200 MG tablet  3 times daily     01/10/18 1830       Varney Biles, MD 01/10/18 1840

## 2018-01-12 LAB — URINE CULTURE

## 2018-02-18 NOTE — ED Provider Notes (Signed)
[facility] EMERGENCY DEPARTMENT Provider Note   CSN: [id number] Arrival date & time: 01/10/18  1654     History   Chief Complaint Chief Complaint  Patient presents with  . Abdominal Pain    HPI Kathryn Jordan is a 62 y.o. female.  HPI 62 year old female comes in with chief complaint of abdominal pain.  Patient states that she has been having intermittent pain in her lower abdominal quadrant for the last 2 years.  Patient typically gets these symptoms because of UTIs.  At this time patient denies any nausea, vomiting, fevers, chills.  The pain is located in the suprapubic region, and described as heaviness, it is nonradiating, and there is no associated vaginal discharge or bleeding.  Past Medical History:  Diagnosis Date  . Anemia   . Anxiety   . Arthritis   . Depression   . GERD (gastroesophageal reflux disease)   . Hyperlipidemia   . Hypertension   . Right knee pain   . Varicose veins     Patient Active Problem List   Diagnosis Date Noted  . Spider veins of both lower extremities 02/26/2016  . Bilateral knee pain 11/05/2015  . Dysthymia 05/11/2015  . Low back pain 02/23/2015  . 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 Plantar fasciitis 05/31/2013  . Right hip pain 05/31/2013  . Right knee pain 05/31/2013  . Left wrist injury 01/18/2013  . Benign microscopic hematuria 01/10/2013  . Menopausal hot flushes 01/10/2013    Past Surgical History:  Procedure Laterality Date  . NO PAST SURGERIES       OB History    Gravida  3   Para  3   Term      Preterm      AB      Living  3     SAB      TAB      Ectopic      Multiple      Live Births               Home Medications    Prior to Admission medications   Medication Sig Start Date End Date Taking? Authorizing Provider  AMBULATORY NON FORMULARY MEDICATION Medication Name: Stress-J 340 mg-Take 1-2 tablets daily    [provider]  AMBULATORY NON FORMULARY MEDICATION Medication Name:  Womens Balance- Take 2 tablets by mouth daily    [provider]  AMBULATORY NON FORMULARY MEDICATION Medication Name: Pregnenolone 50 mg-Take 2 tablet by mouth daily.    [provider]  cephALEXin (KEFLEX) 500 MG capsule Take 1 capsule (500 mg total) by mouth 3 (three) times daily. 01/10/18   Varney Biles, MD  CORTISPORIN 1 % ointment  10/08/15   [provider]  cyclobenzaprine (FLEXERIL) 10 MG tablet Take 10 mg by mouth 3 (three) times daily as needed for muscle spasms.    [provider]  diclofenac (VOLTAREN) 75 MG EC tablet Take 1 tablet (75 mg total) by mouth 2 (two) times daily. 11/02/15   Hudnall, Sharyn Lull, MD  estradiol (ESTRACE VAGINAL) 0.1 MG/GM vaginal cream 1 soybean size drop each night. Patient not taking: Reported on 11/15/2014 11/03/14   Morene Crocker, CNM  Estradiol POWD  12/26/14   [provider]  FLUoxetine (PROZAC) 10 MG capsule Take 10 mg by mouth daily.    [provider]  lidocaine (XYLOCAINE) 5 % ointment Apply 1 application topically daily as needed. Patient not taking: Reported on 02/26/2016 11/03/14   Margurite Auerbach,  Rachelle A, CNM  Multiple Vitamins-Minerals (MULTIVITAMIN WITH MINERALS) tablet Take 1 tablet by mouth daily.    [provider]  NONFORMULARY OR COMPOUNDED ITEM Estradiol 0.02 % 80ml prefilled applicator Sig: apply twice a week 11/15/14   Terrance Mass, MD  ondansetron (ZOFRAN) 4 MG tablet Take 1 tablet (4 mg total) by mouth every 6 (six) hours. 01/10/18   Varney Biles, MD  pantoprazole (PROTONIX) 40 MG tablet Take 1 tablet (40 mg total) by mouth daily. Patient not taking: Reported on 02/26/2016 11/14/13   Esterwood, Amy S, PA-C  phenazopyridine (PYRIDIUM) 200 MG tablet Take 1 tablet (200 mg total) by mouth 3 (three) times daily. 01/10/18   Varney Biles, MD  RESTASIS 0.05 % ophthalmic emulsion  10/11/15   [provider]  RHODIOLA ROSEA PO Take by mouth. Take 1 tablet by mouth daily     [provider]    Family History Family History  Problem Relation Age of Onset  . Heart attack Mother   . Heart disease Mother   . Liver disease Father   . Diabetes Sister   . Diabetes Brother   . Hyperlipidemia Neg Hx   . Hypertension Neg Hx   . Sudden death Neg Hx   . Colon cancer Neg Hx   . Esophageal cancer Neg Hx   . Stomach cancer Neg Hx     Social History Social History   Tobacco Use  . Smoking status: Never Smoker  . Smokeless tobacco: Never Used  Substance Use Topics  . Alcohol use: Yes    Alcohol/week: 0.6 oz    Types: 1 Standard drinks or equivalent per week    Comment: occasional  . Drug use: No     Allergies   Cyclobenzaprine and Meloxicam   Review of Systems Review of Systems  Constitutional: Positive for activity change. Negative for fever.  Gastrointestinal: Positive for abdominal pain.  Genitourinary: Negative for flank pain.  Allergic/Immunologic: Negative for immunocompromised state.  All other systems reviewed and are negative.    Physical Exam Updated Vital Signs BP (!) 141/81 (BP Location: Left Arm)   Pulse 72   Temp 98.1 F (36.7 C) (Oral)   Resp 16   Ht 5\' 3"  (1.6 m)   Wt 68 kg (150 lb)   SpO2 98%   BMI 26.57 kg/m   Physical Exam  Constitutional: She is oriented to person, place, and time. She appears well-developed.  HENT:  Head: Normocephalic and atraumatic.  Eyes: EOM are normal.  Neck: Normal range of motion. Neck supple.  Cardiovascular: Normal rate.  Pulmonary/Chest: Effort normal.  Abdominal: Bowel sounds are normal. There is tenderness in the suprapubic area. There is no rebound and no guarding.  Neurological: She is alert and oriented to person, place, and time.  Skin: Skin is warm and dry.  Nursing note and vitals reviewed.    ED Treatments / Results  Labs (all labs ordered are listed, but only abnormal results are displayed) Labs Reviewed  URINE CULTURE - Abnormal; Notable for the following  components:      Result Value   Culture   (*)    Value: <10,000 COLONIES/mL INSIGNIFICANT GROWTH Performed at [facility], [address], [place], [place] [address]    All other components within normal limits  URINALYSIS, ROUTINE W REFLEX MICROSCOPIC - Abnormal; Notable for the following components:   Specific Gravity, Urine <1.005 (*)    Hgb urine dipstick MODERATE (*)    Nitrite POSITIVE (*)  All other components within normal limits  URINALYSIS, MICROSCOPIC (REFLEX) - Abnormal; Notable for the following components:   Bacteria, UA RARE (*)    All other components within normal limits    EKG None  Radiology No results found.  Procedures Procedures (including critical care time)  Medications Ordered in ED Medications - No data to display   Initial Impression / Assessment and Plan / ED Course  I have reviewed the triage vital signs and the nursing notes.  Pertinent labs & imaging results that were available during my care of the patient were reviewed by me and considered in my medical decision making (see chart for details).     62 year old female comes in with chief complaint of suprapubic pain.  Patient has history of recurrent UTIs, and she has had similar pain in the past because of UTI.  Patient's pain is described as heaviness in the suprapubic region and there is no associated vaginal discharge or bleeding.  No flank pain, constitutional's overall are reassuring.  Differential diagnosis includes UTI/cystitis, ureteral colic, pelvic mass, pelvic infection, ovarian cyst.  Basic labs were ordered, and the UA is nitrite positive. We will start patient on antibiotics, however she has been advised to follow-up with her PCP.  Strict ER return precautions have been discussed, and patient will come back to the ER if her symptoms are getting worse.  At this moment, I do not think patient needs a CT scan of her abdomen based on nontoxic appearance and a immunocompetent  patient, who does not have any peritoneal findings and vital signs are within normal limits.  Final Clinical Impressions(s) / ED Diagnoses   Final diagnoses:  Cystitis    ED Discharge Orders        Ordered    cephALEXin (KEFLEX) 500 MG capsule  3 times daily     01/10/18 1830    phenazopyridine (PYRIDIUM) 200 MG tablet  3 times daily,   Status:  Discontinued     01/10/18 1830    ondansetron (ZOFRAN) 4 MG tablet  Every 6 hours,   Status:  Discontinued     01/10/18 1836    phenazopyridine (PYRIDIUM) 200 MG tablet  3 times daily     01/10/18 1836    ondansetron (ZOFRAN) 4 MG tablet  Every 6 hours     01/10/18 1837       Varney Biles, MD 02/18/18 [phone]

## 2018-04-20 ENCOUNTER — Ambulatory Visit (INDEPENDENT_AMBULATORY_CARE_PROVIDER_SITE_OTHER): Payer: Self-pay | Admitting: Obstetrics & Gynecology

## 2018-04-20 ENCOUNTER — Encounter: Payer: Self-pay | Admitting: Obstetrics & Gynecology

## 2018-04-20 VITALS — BP 110/80 | Ht 64.0 in | Wt 155.0 lb

## 2018-04-20 DIAGNOSIS — R32 Unspecified urinary incontinence: Secondary | ICD-10-CM

## 2018-04-20 DIAGNOSIS — Z13820 Encounter for screening for osteoporosis: Secondary | ICD-10-CM

## 2018-04-20 DIAGNOSIS — Z780 Asymptomatic menopausal state: Secondary | ICD-10-CM

## 2018-04-20 DIAGNOSIS — Z01419 Encounter for gynecological examination (general) (routine) without abnormal findings: Secondary | ICD-10-CM

## 2018-04-20 NOTE — Progress Notes (Signed)
Kathryn Jordan 1955-11-30 [id number]   History:    62 y.o. G3P3L3 Married  RP:  New patient presenting for annual gyn exam   HPI: Menopause, well on no HRT.  No PMB.  No current pelvic pain.  Had pelvic pain on-off x 11/2017.  Seen at ER, given ABTx for UTI.  Seen by Urologist 04/16/18, investigation negative. Mild post miction dribbling. Rarely sexually active.  Breasts normal.  BMI 26.61.  Walks a lot at work.  Health labs with Family MD.  Past medical history,surgical history, family history and social history were all reviewed and documented in the EPIC chart.  Gynecologic History No LMP recorded. Patient is postmenopausal. Contraception: post menopausal status Last Pap: 09/2014. Results were: Negative Last mammogram: 03/2017. Results were: Negative Bone Density: Never Colonoscopy: Never  Obstetric History OB History  Gravida Para Term Preterm AB Living  3 3     0 3  SAB TAB Ectopic Multiple Live Births      0        # Outcome Date GA Lbr Len/2nd Weight Sex Delivery Anes PTL Lv  3 Para           2 Para           1 Para              ROS: A ROS was performed and pertinent positives and negatives are included in the history.  GENERAL: No fevers or chills. HEENT: No change in vision, no earache, sore throat or sinus congestion. NECK: No pain or stiffness. CARDIOVASCULAR: No chest pain or pressure. No palpitations. PULMONARY: No shortness of breath, cough or wheeze. GASTROINTESTINAL: No abdominal pain, nausea, vomiting or diarrhea, melena or bright red blood per rectum. GENITOURINARY: No urinary frequency, urgency, hesitancy or dysuria. MUSCULOSKELETAL: No joint or muscle pain, no back pain, no recent trauma. DERMATOLOGIC: No rash, no itching, no lesions. ENDOCRINE: No polyuria, polydipsia, no heat or cold intolerance. No recent change in weight. HEMATOLOGICAL: No anemia or easy bruising or bleeding. NEUROLOGIC: No headache, seizures, numbness, tingling or weakness. PSYCHIATRIC: No  depression, no loss of interest in normal activity or change in sleep pattern.     Exam:   BP 110/80 (BP Location: Right Arm, Patient Position: Sitting, Cuff Size: Normal)   Ht 5\' 4"  (1.626 m)   Wt 155 lb (70.3 kg)   BMI 26.61 kg/m   Body mass index is 26.61 kg/m.  General appearance : Well developed well nourished female. No acute distress HEENT: Eyes: no retinal hemorrhage or exudates,  Neck supple, trachea midline, no carotid bruits, no thyroidmegaly Lungs: Clear to auscultation, no rhonchi or wheezes, or rib retractions  Heart: Regular rate and rhythm, no murmurs or gallops Breast:Examined in sitting and supine position were symmetrical in appearance, no palpable masses or tenderness,  no skin retraction, no nipple inversion, no nipple discharge, no skin discoloration, no axillary or supraclavicular lymphadenopathy Abdomen: no palpable masses or tenderness, no rebound or guarding Extremities: no edema or skin discoloration or tenderness  Pelvic: Vulva: Normal             Vagina: No gross lesions or discharge  Cervix: No gross lesions or discharge,  Pap reflex done  Uterus  AV, normal size, shape and consistency, non-tender and mobile  Adnexa  Without masses or tenderness  Anus: Normal   Assessment/Plan:  62 y.o. female for annual exam   1. Encounter for routine gynecological examination with Papanicolaou smear of cervix Normal  gynecologic exam.  Pap reflex done.  Breast exam normal.  Last mammogram was negative August 2018.  Patient will schedule screening mammogram now.  Health labs with family physician.  Needs to schedule screening colonoscopy referral to gastroenterology requested.  2. Menopause present Well on no hormone replacement therapy.  No postmenopausal bleeding.  3. Urinary incontinence in female Recommend Kegel exercises.  Refer to physical therapy for pelvic floor reinforcement.  Regular emptying of the bladder recommended at least every 3 hours.  Avoid  bladder irritants such as caffeine tea and chocolate.  4. Screening for osteoporosis Vitamin D supplements, calcium intake of 1.5 g/day including diet and supplements, regular weightbearing physical activity recommended.  Follow-up here for bone density. - [facility] Bone Density; Future  Counseling on above issues and coordination of care more than 50% for 10 minutes.  Princess Bruins MD, 9:14 AM 04/20/2018

## 2018-04-21 ENCOUNTER — Telehealth: Payer: Self-pay | Admitting: *Deleted

## 2018-04-21 DIAGNOSIS — Z1211 Encounter for screening for malignant neoplasm of colon: Secondary | ICD-10-CM

## 2018-04-21 DIAGNOSIS — N8189 Other female genital prolapse: Secondary | ICD-10-CM

## 2018-04-21 NOTE — Telephone Encounter (Signed)
-----   Message from Princess Bruins, MD sent at 04/20/2018  9:45 AM EDT ----- Regarding: Referrals Refer to Physical therapy for pelvic floor reinforcement.   Refer to [facility] for Colonoscopy.

## 2018-04-21 NOTE — Telephone Encounter (Signed)
Orders placed at [facility] location for PT and  for screening colonoscopy they both will call to schedule.

## 2018-04-22 LAB — PAP IG W/ RFLX HPV ASCU

## 2018-04-22 LAB — HUMAN PAPILLOMAVIRUS, HIGH RISK: HPV DNA High Risk: NOT DETECTED

## 2018-04-24 ENCOUNTER — Encounter: Payer: Self-pay | Admitting: Obstetrics & Gynecology

## 2018-04-24 NOTE — Patient Instructions (Addendum)
1. Encounter for routine gynecological examination with Papanicolaou smear of cervix Normal gynecologic exam.  Pap reflex done.  Breast exam normal.  Last mammogram was negative August 2018.  Patient will schedule screening mammogram now.  Health labs with family physician.  Needs to schedule screening colonoscopy referral to gastroenterology requested.  2. Menopause present Well on no hormone replacement therapy.  No postmenopausal bleeding.  3. Urinary incontinence in female Recommend Kegel exercises.  Refer to physical therapy for pelvic floor reinforcement.  Regular emptying of the bladder recommended at least every 3 hours.  Avoid bladder irritants such as caffeine tea and chocolate.  4. Screening for osteoporosis Vitamin D supplements, calcium intake of 1.5 g/day including diet and supplements, regular weightbearing physical activity recommended.  Follow-up here for bone density. - [facility] Bone Density; Future  Hadli, fue un placer encontrarle hoy!  Voy a informarle de sus [employer].   Ejercicios de Kegel Barrister's clerk) Los ejercicios de Kegel ayudan a fortalecer los msculos que sostienen el recto, la vagina, el intestino delgado, la vejiga y [place]. Los ejercicios de Kegel pueden ayudar a lo siguiente:  Mejorar el control de la vejiga y de los intestinos.  Mejorar la respuesta sexual.  Reducir los problemas o las molestias durante el [place]. Los ejercicios de Kegel implican apretar los msculos del suelo plvico, que son los mismos msculos que comprime cuando trata de Scientist, water quality el flujo de la [place]. Los ejercicios se pueden Regulatory affairs officer est sentado, parado o [place], pero lo mejor es variar la posicin. EJERCICIOS DE KEGEL 1. Apriete bien los msculos del suelo plvico. Debera sentir una elevacin firme en el rea del recto. Si es [place], tambin debera sentir una compresin en el rea de la vagina. [place], las nalgas y las piernas  relajadas. 2. Mantenga los msculos apretados durante 10segundos como mximo. 3. Relaje los msculos. Repita este ejercicio 50veces al da o como se lo haya indicado el mdico. Contine haciendo este ejercicio durante al menos 4 a 6semanas y Doctor, general practice tiempo que le haya indicado el mdico. Esta informacin no tiene Marine scientist el consejo del mdico. Asegrese de hacerle al mdico cualquier pregunta que tenga. Document Released: 07/07/2012 Document Revised: 06/10/2015 Document Reviewed: 06/10/2015 Elsevier Interactive Patient Education  [employer].

## 2018-05-04 NOTE — Telephone Encounter (Signed)
Both office has left message for patient to call, I will close my encounter.

## 2018-06-14 ENCOUNTER — Encounter: Payer: Self-pay | Admitting: Family

## 2018-08-03 ENCOUNTER — Ambulatory Visit (INDEPENDENT_AMBULATORY_CARE_PROVIDER_SITE_OTHER): Payer: Self-pay | Admitting: Women's Health

## 2018-08-03 ENCOUNTER — Other Ambulatory Visit: Payer: Self-pay | Admitting: Women's Health

## 2018-08-03 ENCOUNTER — Encounter: Payer: Self-pay | Admitting: Women's Health

## 2018-08-03 VITALS — BP 130/78

## 2018-08-03 DIAGNOSIS — R350 Frequency of micturition: Secondary | ICD-10-CM

## 2018-08-03 DIAGNOSIS — N898 Other specified noninflammatory disorders of vagina: Secondary | ICD-10-CM

## 2018-08-03 DIAGNOSIS — N3000 Acute cystitis without hematuria: Secondary | ICD-10-CM

## 2018-08-03 MED ORDER — FLUCONAZOLE 150 MG PO TABS: 150.0000 mg | ORAL_TABLET | Freq: Once | ORAL | 0 refills | Status: AC

## 2018-08-03 MED ORDER — SULFAMETHOXAZOLE-TRIMETHOPRIM 800-160 MG PO TABS: 1.0000 | ORAL_TABLET | Freq: Two times a day (BID) | ORAL | 0 refills | Status: DC

## 2018-08-03 NOTE — Addendum Note (Signed)
Addended by: Lorine Bears on: 08/03/2018 03:25 PM   Modules accepted: Orders

## 2018-08-03 NOTE — Patient Instructions (Signed)
Urinary Tract Infection, Adult A urinary tract infection (UTI) is an infection of any part of the urinary tract. The urinary tract includes:  The kidneys.  The ureters.  The bladder.  The urethra. These organs make, store, and get rid of pee (urine) in the body. What are the causes? This is caused by germs (bacteria) in your genital area. These germs grow and cause swelling (inflammation) of your urinary tract. What increases the risk? You are more likely to develop this condition if:  You have a small, thin tube (catheter) to drain pee.  You cannot control when you pee or poop (incontinence).  You are female, and: ? You use these methods to prevent pregnancy: ? A medicine that kills sperm (spermicide). ? A device that blocks sperm (diaphragm). ? You have low levels of a female hormone (estrogen). ? You are pregnant.  You have genes that add to your risk.  You are sexually active.  You take antibiotic medicines.  You have trouble peeing because of: ? A prostate that is bigger than normal, if you are female. ? A blockage in the part of your body that drains pee from the bladder (urethra). ? A kidney stone. ? A nerve condition that affects your bladder (neurogenic bladder). ? Not getting enough to drink. ? Not peeing often enough.  You have other conditions, such as: ? Diabetes. ? A weak disease-fighting system (immune system). ? Sickle cell disease. ? Gout. ? Injury of the spine. What are the signs or symptoms? Symptoms of this condition include:  Needing to pee right away (urgently).  Peeing often.  Peeing small amounts often.  Pain or burning when peeing.  Blood in the pee.  Pee that smells bad or not like normal.  Trouble peeing.  Pee that is cloudy.  Fluid coming from the vagina, if you are female.  Pain in the belly or lower back. Other symptoms include:  Throwing up (vomiting).  No urge to eat.  Feeling mixed up (confused).  Being tired  and grouchy (irritable).  A fever.  Watery poop (diarrhea). How is this treated? This condition may be treated with:  Antibiotic medicine.  Other medicines.  Drinking enough water. Follow these instructions at home:  Medicines  Take over-the-counter and prescription medicines only as told by your doctor.  If you were prescribed an antibiotic medicine, take it as told by your doctor. Do not stop taking it even if you start to feel better. General instructions  Make sure you: ? Pee until your bladder is empty. ? Do not hold pee for a long time. ? Empty your bladder after sex. ? Wipe from front to back after pooping if you are a female. Use each tissue one time when you wipe.  Drink enough fluid to keep your pee pale yellow.  Keep all follow-up visits as told by your doctor. This is important. Contact a doctor if:  You do not get better after 1-2 days.  Your symptoms go away and then come back. Get help right away if:  You have very bad back pain.  You have very bad pain in your lower belly.  You have a fever.  You are sick to your stomach (nauseous).  You are throwing up. Summary  A urinary tract infection (UTI) is an infection of any part of the urinary tract.  This condition is caused by germs in your genital area.  There are many risk factors for a UTI. These include having a small, thin   tube to drain pee and not being able to control when you pee or poop.  Treatment includes antibiotic medicines for germs.  Drink enough fluid to keep your pee pale yellow. This information is not intended to replace advice given to you by your health care provider. Make sure you discuss any questions you have with your health care provider. Document Released: 01/07/2008 Document Revised: 01/28/2018 Document Reviewed: 01/28/2018 Elsevier Interactive Patient Education  2019 Elsevier Inc.  

## 2018-08-03 NOTE — Progress Notes (Signed)
62 y.o MHF G3P3  reports vaginal itching and burning with urination for the past 7 days. Has used over the counter Monistat with no relief. Denies vaginal discharge, odor,  Back/ pelvic pain, fever, nausea, vomiting or diarrhea. Not sexually active.  postmenopausal on no HRT, with no bleeding or spotting.  Abdominoplasty 10 weeks ago, continues with soreness at incisional site. Medical history includes vulvodynia, plantar fascitis.  Exam: Appears well, slightly uncomfortable from abdominoplasty surgery.  CVAT.  Abdomen soft, slight tenderness at distal ends of incisional line, incisional line well approximated without erythema, appears to be healing well.  External genitalia within normal limits. Speculum exam no visible discharge, erythema or odor noted.  Wet prep negative. UA: 2+ Leukocytes, 2 blood, 40-60 WBC, 10-20 RBC, moderate bacteria,  0-5 Squamous epithelial cells  Urinary Tract Infection Vaginal itching  Plan: Bactrim 800-160 mg twice a day for three days, Diflucan 150 Mg tablet one dose proper use given and reviewed.   Discussed urinary tract prevention techniques. Encouraged to drink more water. Instructed to call back if symptoms persist.

## 2018-08-05 LAB — URINALYSIS, COMPLETE W/RFL CULTURE
BILIRUBIN URINE: NEGATIVE
GLUCOSE, UA: NEGATIVE
Hyaline Cast: NONE SEEN /LPF
KETONES UR: NEGATIVE
NITRITES URINE, INITIAL: NEGATIVE
PH: 7 (ref 5.0–8.0)
Protein, ur: NEGATIVE
SPECIFIC GRAVITY, URINE: 1.01 (ref 1.001–1.03)

## 2018-08-05 LAB — CULTURE INDICATED

## 2018-08-05 LAB — URINE CULTURE
MICRO NUMBER:: 91557078
SPECIMEN QUALITY: ADEQUATE

## 2018-08-06 ENCOUNTER — Other Ambulatory Visit: Payer: Self-pay | Admitting: Women's Health

## 2018-08-06 MED ORDER — NITROFURANTOIN MONOHYD MACRO 100 MG PO CAPS: 100.0000 mg | ORAL_CAPSULE | Freq: Two times a day (BID) | ORAL | 0 refills | Status: DC

## 2019-08-05 HISTORY — PX: BLEPHAROPLASTY: SUR158

## 2022-10-29 ENCOUNTER — Encounter: Payer: Self-pay | Admitting: Internal Medicine

## 2022-12-08 ENCOUNTER — Ambulatory Visit (AMBULATORY_SURGERY_CENTER): Payer: Self-pay

## 2022-12-08 VITALS — Ht 64.0 in | Wt 150.0 lb

## 2022-12-08 DIAGNOSIS — Z1211 Encounter for screening for malignant neoplasm of colon: Secondary | ICD-10-CM

## 2022-12-08 MED ORDER — PEG 3350-KCL-NA BICARB-NACL 420 G PO SOLR: 4000.0000 mL | Freq: Once | ORAL | 0 refills | Status: AC

## 2022-12-08 NOTE — Progress Notes (Signed)
Pre visit completed via phone call; Patient verified name, DOB, and address;  No egg or soy allergy known to patient;  No issues known to pt with past sedation with any surgeries or procedures; Patient denies ever being told they had issues or difficulty with intubation;  No FH of Malignant Hyperthermia; Pt is not on diet pills; Pt is not on home 02;  Pt is not on blood thinners;  Pt reports issues with constipation - patient advised to increase oral fluids, activity, and fruits/veggies; also advised she can take OTC stool softener or gentle laxative if needed;  No A fib or A flutter Have any cardiac testing pending--NO Pt instructed to use Singlecare.com or GoodRx for a price reduction on prep   Insurance verified during PV appt=Medicare A/B  Patient's chart reviewed by Cathlyn Parsons CNRA prior to previsit and patient appropriate for the [facility].  Previsit completed and red dot placed by patient's name on their procedure day (on provider's schedule).    Instructions printed and placed on 2nd floor receptionist desk for patient or husband to pick up per their request;

## 2022-12-23 ENCOUNTER — Telehealth: Payer: Self-pay | Admitting: Internal Medicine

## 2022-12-23 NOTE — Telephone Encounter (Signed)
Spoke with patient's husband, and let him know that she would be fine to come for procedure. Instructed him to make sure she drinks 8-10oz of fluid every hour to make sure she is cleaned out. He verbalized understanding, and had no further concerns at the end of the call.

## 2022-12-23 NOTE — Telephone Encounter (Signed)
Inbound call from patient husband , he stated wife eat some nuts, corn chips this past weekend also yesterday she ate 2 apples with the peel and seeds and they dont know how this will affect procedure on 5/23.Please advise.

## 2022-12-23 NOTE — Telephone Encounter (Signed)
Patients husband called requesting a call back because he has some concerns in a change of medication that he forgot to mention. Please advise, thank you.

## 2022-12-23 NOTE — Telephone Encounter (Addendum)
Spoke with pt husband, and he stated that she started cefuroxime 500mg , yesterday, to treat a yeast infection. Advised pt husband that she can take her morning dose with 1 piece of toast before 9am Wednesday morning, and to take the evening dose with jello or popsicles. He verbalized understanding, and had no further concerns at the end of the call.

## 2022-12-25 ENCOUNTER — Encounter: Payer: Self-pay | Admitting: Internal Medicine

## 2022-12-25 ENCOUNTER — Ambulatory Visit (AMBULATORY_SURGERY_CENTER): Payer: Self-pay | Admitting: Internal Medicine

## 2022-12-25 VITALS — BP 131/49 | HR 63 | Temp 98.4°F | Resp 19 | Ht 64.0 in | Wt 150.0 lb

## 2022-12-25 DIAGNOSIS — Z1211 Encounter for screening for malignant neoplasm of colon: Secondary | ICD-10-CM

## 2022-12-25 DIAGNOSIS — D122 Benign neoplasm of ascending colon: Secondary | ICD-10-CM | POA: Diagnosis not present

## 2022-12-25 DIAGNOSIS — K635 Polyp of colon: Secondary | ICD-10-CM | POA: Diagnosis not present

## 2022-12-25 MED ORDER — SODIUM CHLORIDE 0.9 % IV SOLN
500.0000 mL | Freq: Once | INTRAVENOUS | Status: DC
Start: 2022-12-25 — End: 2022-12-25

## 2022-12-25 NOTE — Progress Notes (Signed)
Uneventful anesthetic. Report to pacu rn. Vss. Care resumed by rn. 

## 2022-12-25 NOTE — Progress Notes (Signed)
Called to room to assist during endoscopic procedure.  Patient ID and intended procedure confirmed with present staff. Received instructions for my participation in the procedure from the performing physician.  

## 2022-12-25 NOTE — Progress Notes (Signed)
Pt's states no medical or surgical changes since previsit or office visit. 

## 2022-12-25 NOTE — Progress Notes (Signed)
GASTROENTEROLOGY PROCEDURE H&P NOTE   Primary Care Physician: Smothers, Cathleen Corti, NP    Reason for Procedure:  Colon cancer screening  Plan:    Colonoscopy  Patient is appropriate for endoscopic procedure(s) in the ambulatory ([facility]) setting.  The nature of the procedure, as well as the risks, benefits, and alternatives were carefully and thoroughly reviewed with the patient. Ample time for discussion and questions allowed. The patient understood, was satisfied, and agreed to proceed.     HPI: Kathryn Jordan is a 67 y.o. female who presents for screening colonoscopy.  Medical history as below.  Tolerated the prep.  No recent chest pain or shortness of breath.  No abdominal pain today.  Past Medical History:  Diagnosis Date   Anemia    Anxiety    Arthritis    Cataract    bilateral sx   Depression    GERD (gastroesophageal reflux disease)    Hyperlipidemia    Hypertension    Osteopenia    Right knee pain    Seasonal allergies    Varicose veins     Past Surgical History:  Procedure Laterality Date   ABDOMINOPLASTY/PANNICULECTOMY WITH LIPOSUCTION  2019   BLEPHAROPLASTY Bilateral 2021   CATARACT EXTRACTION, BILATERAL Bilateral 2016   COLONOSCOPY      Prior to Admission medications   Medication Sig Start Date End Date Taking? Authorizing Provider  B Complex Vitamins (VITAMIN B COMPLEX PO) Take 1 tablet by mouth daily at 6 (six) AM.   Yes [provider]  cefUROXime (CEFTIN) 500 MG tablet Take 500 mg by mouth 2 (two) times daily with a meal. 12/22/22 12/29/22 Yes [provider]  cyclobenzaprine (FLEXERIL) 10 MG tablet Take 10 mg by mouth 3 (three) times daily as needed for muscle spasms. 04/29/22  Yes [provider]  diclofenac (VOLTAREN) 75 MG EC tablet Take 75 mg by mouth 2 (two) times daily. 11/10/16  Yes [provider]  Fluoxetine HCl, PMDD, 10 MG TABS Take 1.5 tablets by mouth daily. 09/01/22  Yes [provider]  RESTASIS  0.05 % ophthalmic emulsion  10/11/15  Yes [provider]  Vitamins-Lipotropics (MEGA MULTIPLE/CHELATED MINERAL) TABS Take 1 tablet by mouth daily at 8 pm.   Yes [provider]  albuterol (VENTOLIN HFA) 108 (90 Base) MCG/ACT inhaler Inhale 2 puffs into the lungs every 6 (six) hours as needed for wheezing or shortness of breath. Patient not taking: Reported on 12/25/2022 11/08/22 11/08/23  [provider]  cetirizine (ZYRTEC) 10 MG tablet Take 10 mg by mouth daily as needed for allergies or rhinitis.    [provider]  diclofenac Sodium (VOLTAREN) 1 % GEL Apply 2 g topically 4 (four) times daily as needed (ARTHRITIS).    [provider]  hydrocortisone cream (CURAD HYDROCORTISONE) 1 % Apply 1 Application topically 2 (two) times daily. 12/19/22 06/17/23  [provider]  hydrOXYzine (ATARAX/VISTARIL) 25 MG tablet Take 25 mg by mouth 2 (two) times daily.    [provider]    Current Outpatient Medications  Medication Sig Dispense Refill   B Complex Vitamins (VITAMIN B COMPLEX PO) Take 1 tablet by mouth daily at 6 (six) AM.     cefUROXime (CEFTIN) 500 MG tablet Take 500 mg by mouth 2 (two) times daily with a meal.     cyclobenzaprine (FLEXERIL) 10 MG tablet Take 10 mg by mouth 3 (three) times daily as needed for muscle spasms.     diclofenac (VOLTAREN) 75 MG  EC tablet Take 75 mg by mouth 2 (two) times daily.     Fluoxetine HCl, PMDD, 10 MG TABS Take 1.5 tablets by mouth daily.     RESTASIS 0.05 % ophthalmic emulsion      Vitamins-Lipotropics (MEGA MULTIPLE/CHELATED MINERAL) TABS Take 1 tablet by mouth daily at 8 pm.     albuterol (VENTOLIN HFA) 108 (90 Base) MCG/ACT inhaler Inhale 2 puffs into the lungs every 6 (six) hours as needed for wheezing or shortness of breath. (Patient not taking: Reported on 12/25/2022)     cetirizine (ZYRTEC) 10 MG tablet Take 10 mg by mouth daily as needed for allergies or rhinitis.     diclofenac Sodium (VOLTAREN)  1 % GEL Apply 2 g topically 4 (four) times daily as needed (ARTHRITIS).     hydrocortisone cream (CURAD HYDROCORTISONE) 1 % Apply 1 Application topically 2 (two) times daily.     hydrOXYzine (ATARAX/VISTARIL) 25 MG tablet Take 25 mg by mouth 2 (two) times daily.     Current Facility-Administered Medications  Medication Dose Route Frequency Provider Last Rate Last Admin   0.9 %  sodium chloride infusion  500 mL Intravenous Once Adjoa Althouse, Carie Caddy, MD        Allergies as of 12/25/2022 - Review Complete 12/25/2022  Allergen Reaction Noted   Meloxicam  01/17/2013    Family History  Problem Relation Age of Onset   Heart attack Mother    Heart disease Mother    Liver disease Father    Diabetes Sister    Diabetes Brother    Hyperlipidemia Neg Hx    Hypertension Neg Hx    Sudden death Neg Hx    Colon cancer Neg Hx    Esophageal cancer Neg Hx    Stomach cancer Neg Hx    Colon polyps Neg Hx    Rectal cancer Neg Hx     Social History   Socioeconomic History   Marital status: Married    Spouse name: Not on file   Number of children: Not on file   Years of education: Not on file   Highest education level: Not on file  Occupational History   Not on file  Tobacco Use   Smoking status: Never   Smokeless tobacco: Never  Vaping Use   Vaping Use: Never used  Substance and Sexual Activity   Alcohol use: Yes    Alcohol/week: 0.0 - 3.0 standard drinks of alcohol    Comment: occasional   Drug use: Not Currently    Types: Marijuana   Sexual activity: Yes    Birth control/protection: None    Comment: INTERCOURSE AGE 77,LESS THAN 5 SEXUAL PARTNERS,DES UNKNOWN  Other Topics Concern   Not on file  Social History Narrative   Not on file   Social Determinants of Health   Financial Resource Strain: Not on file  Food Insecurity: Not on file  Transportation Needs: Not on file  Physical Activity: Not on file  Stress: Not on file  Social Connections: Not on file  Intimate Partner  Violence: Not on file    Physical Exam: Vital signs in last 24 hours: @BP  (!) 131/56   Pulse 68   Temp 98.4 F (36.9 C) (Temporal)   Ht 5\' 4"  (1.626 m)   Wt 150 lb (68 kg)   SpO2 98%   BMI 25.75 kg/m  GEN: NAD EYE: Sclerae anicteric ENT: MMM CV: Non-tachycardic Pulm: CTA b/l GI: Soft, NT/ND NEURO:  Alert & Oriented x 3  Erick Blinks, MD [facility]  12/25/2022 1:55 PM

## 2022-12-25 NOTE — Patient Instructions (Signed)
Handout on hemorrhoids, polyps, and diverticulosis given to patient.  Await pathology results. Resume previous diet and continue present medications Repeat colonoscopy for surveillance will be determined based off of pathology results.   YOU HAD AN ENDOSCOPIC PROCEDURE TODAY AT THE [facility]:   Refer to the procedure report that was given to you for any specific questions about what was found during the examination.  If the procedure report does not answer your questions, please call your gastroenterologist to clarify.  If you requested that your care partner not be given the details of your procedure findings, then the procedure report has been included in a sealed envelope for you to review at your convenience later.  YOU SHOULD EXPECT: Some feelings of bloating in the abdomen. Passage of more gas than usual.  Walking can help get rid of the air that was put into your GI tract during the procedure and reduce the bloating. If you had a lower endoscopy (such as a colonoscopy or flexible sigmoidoscopy) you may notice spotting of blood in your stool or on the toilet paper. If you underwent a bowel prep for your procedure, you may not have a normal bowel movement for a few days.  Please Note:  You might notice some irritation and congestion in your nose or some drainage.  This is from the oxygen used during your procedure.  There is no need for concern and it should clear up in a day or so.  SYMPTOMS TO REPORT IMMEDIATELY:  Following lower endoscopy (colonoscopy or flexible sigmoidoscopy):  Excessive amounts of blood in the stool  Significant tenderness or worsening of abdominal pains  Swelling of the abdomen that is new, acute  Fever of 100F or higher  For urgent or emergent issues, a gastroenterologist can be reached at any hour by calling [phone]. Do not use MyChart messaging for urgent concerns.    DIET:  We do recommend a small meal at first, but then you may proceed  to your regular diet.  Drink plenty of fluids but you should avoid alcoholic beverages for 24 hours.  ACTIVITY:  You should plan to take it easy for the rest of today and you should NOT DRIVE or use heavy machinery until tomorrow (because of the sedation medicines used during the test).    FOLLOW UP: Our staff will call the number listed on your records the next business day following your procedure.  We will call around 7:15- 8:00 am to check on you and address any questions or concerns that you may have regarding the information given to you following your procedure. If we do not reach you, we will leave a message.     If any biopsies were taken you will be contacted by phone or by letter within the next 1-3 weeks.  Please call us at [phone] if you have not heard about the biopsies in 3 weeks.    SIGNATURES/CONFIDENTIALITY: You and/or your care partner have signed paperwork which will be entered into your electronic medical record.  These signatures attest to the fact that that the information above on your After Visit Summary has been reviewed and is understood.  Full responsibility of the confidentiality of this discharge information lies with you and/or your care-partner.  

## 2022-12-25 NOTE — Op Note (Signed)
[facility] Patient Name: Kathryn Jordan Procedure Date: 12/25/2022 1:59 PM MRN: [id number] Endoscopist: Beverley Fiedler , MD, [id number] Age: 67 Referring MD:  Date of Birth: 24-May-1956 Gender: Female Account #: [id number] Procedure:                Colonoscopy Indications:              Screening for colorectal malignant neoplasm Medicines:                Monitored Anesthesia Care Procedure:                Pre-Anesthesia Assessment:                           - Prior to the procedure, a History and Physical                            was performed, and patient medications and                            allergies were reviewed. The patient's tolerance of                            previous anesthesia was also reviewed. The risks                            and benefits of the procedure and the sedation                            options and risks were discussed with the patient.                            All questions were answered, and informed consent                            was obtained. Prior Anticoagulants: The patient has                            taken no anticoagulant or antiplatelet agents. ASA                            Grade Assessment: II - A patient with mild systemic                            disease. After reviewing the risks and benefits,                            the patient was deemed in satisfactory condition to                            undergo the procedure.                           After obtaining informed consent, the colonoscope  was passed under direct vision. Throughout the                            procedure, the patient's blood pressure, pulse, and                            oxygen saturations were monitored continuously. The                            Olympus PCF-[id number] (#[id number]) Colonoscope was                            introduced through the anus and advanced to the                            cecum, identified by  appendiceal orifice and                            ileocecal valve. The colonoscopy was performed                            without difficulty. The patient tolerated the                            procedure well. The quality of the bowel                            preparation was good. The ileocecal valve,                            appendiceal orifice, and rectum were photographed. Scope In: 2:07:09 PM Scope Out: 2:23:07 PM Scope Withdrawal Time: 0 hours 12 minutes 35 seconds  Total Procedure Duration: 0 hours 15 minutes 58 seconds  Findings:                 The digital rectal exam was normal.                           Two sessile polyps were found in the ascending                            colon. The polyps were 3 to 4 mm in size. These                            polyps were removed with a cold snare. Resection                            and retrieval were complete.                           Multiple medium-mouthed and small-mouthed                            diverticula were found in the sigmoid colon.  A diffuse area of mild melanosis was found in the                            entire colon.                           Internal hemorrhoids were found during                            retroflexion. The hemorrhoids were small. Complications:            No immediate complications. Estimated Blood Loss:     Estimated blood loss was minimal. Impression:               - Two 3 to 4 mm polyps in the ascending colon,                            removed with a cold snare. Resected and retrieved.                           - Mild diverticulosis in the sigmoid colon.                           - Melanosis in the colon.                           - Small internal hemorrhoids. Recommendation:           - Patient has a contact number available for                            emergencies. The signs and symptoms of potential                            delayed complications were  discussed with the                            patient. Return to normal activities tomorrow.                            Written discharge instructions were provided to the                            patient.                           - Resume previous diet.                           - Continue present medications.                           - Await pathology results.                           - Repeat colonoscopy is recommended. The  colonoscopy date will be determined after pathology                            results from today's exam become available for                            review. Beverley Fiedler, MD 12/25/2022 2:26:01 PM This report has been signed electronically.

## 2022-12-26 ENCOUNTER — Telehealth: Payer: Self-pay

## 2022-12-26 NOTE — Telephone Encounter (Signed)
Attempted f/u call. No answer, left VM. 

## 2023-01-02 ENCOUNTER — Encounter: Payer: Self-pay | Admitting: Internal Medicine

## 2023-12-22 ENCOUNTER — Other Ambulatory Visit (INDEPENDENT_AMBULATORY_CARE_PROVIDER_SITE_OTHER)

## 2023-12-22 ENCOUNTER — Encounter: Payer: Self-pay | Admitting: Gastroenterology

## 2023-12-22 ENCOUNTER — Ambulatory Visit (INDEPENDENT_AMBULATORY_CARE_PROVIDER_SITE_OTHER): Admitting: Gastroenterology

## 2023-12-22 VITALS — BP 126/62 | HR 51 | Ht 64.0 in | Wt 159.0 lb

## 2023-12-22 DIAGNOSIS — Z860100 Personal history of colon polyps, unspecified: Secondary | ICD-10-CM

## 2023-12-22 DIAGNOSIS — Z860101 Personal history of adenomatous and serrated colon polyps: Secondary | ICD-10-CM | POA: Diagnosis not present

## 2023-12-22 DIAGNOSIS — R1011 Right upper quadrant pain: Secondary | ICD-10-CM

## 2023-12-22 LAB — COMPREHENSIVE METABOLIC PANEL WITH GFR
ALT: 17 U/L (ref 0–35)
AST: 17 U/L (ref 0–37)
Albumin: 3.9 g/dL (ref 3.5–5.2)
Alkaline Phosphatase: 82 U/L (ref 39–117)
BUN: 17 mg/dL (ref 6–23)
CO2: 29 meq/L (ref 19–32)
Calcium: 8.8 mg/dL (ref 8.4–10.5)
Chloride: 99 meq/L (ref 96–112)
Creatinine, Ser: 0.6 mg/dL (ref 0.40–1.20)
GFR: 92.17 mL/min (ref >60.00)
Glucose, Bld: 81 mg/dL (ref 70–99)
Potassium: 3.8 meq/L (ref 3.5–5.1)
Sodium: 136 meq/L (ref 135–145)
Total Bilirubin: 0.4 mg/dL (ref 0.2–1.2)
Total Protein: 7 g/dL (ref 6.0–8.3)

## 2023-12-22 LAB — CBC WITH DIFFERENTIAL/PLATELET
Basophils Absolute: 0 10*3/uL (ref 0.0–0.1)
Basophils Relative: 0.7 % (ref 0.0–3.0)
Eosinophils Absolute: 0.2 10*3/uL (ref 0.0–0.7)
Eosinophils Relative: 3.4 % (ref 0.0–5.0)
HCT: 38.6 % (ref 36.0–46.0)
Hemoglobin: 13.4 g/dL (ref 12.0–15.0)
Lymphocytes Relative: 36.5 % (ref 12.0–46.0)
Lymphs Abs: 2.6 10*3/uL (ref 0.7–4.0)
MCHC: 34.8 g/dL (ref 30.0–36.0)
MCV: 86.7 fl (ref 78.0–100.0)
Monocytes Absolute: 0.6 10*3/uL (ref 0.1–1.0)
Monocytes Relative: 8.7 % (ref 3.0–12.0)
Neutro Abs: 3.6 10*3/uL (ref 1.4–7.7)
Neutrophils Relative %: 50.7 % (ref 43.0–77.0)
Platelets: 270 10*3/uL (ref 150.0–400.0)
RBC: 4.45 Mil/uL (ref 3.87–5.11)
RDW: 12.7 % (ref 11.5–15.5)
WBC: 7.1 10*3/uL (ref 4.0–10.5)

## 2023-12-22 MED ORDER — OMEPRAZOLE 40 MG PO CPDR: 40.0000 mg | DELAYED_RELEASE_CAPSULE | Freq: Every day | ORAL | 3 refills | Status: DC

## 2023-12-22 NOTE — Patient Instructions (Signed)
 Your provider has requested that you go to the basement level for lab work before leaving today. Press "B" on the elevator. The lab is located at the first door on the left as you exit the elevator.  We have sent the following medications to your pharmacy for you to pick up at your convenience:  Omeprazole 40mg .  You have been scheduled for a HIDA scan at [facility] (1st floor) on 01/06/2024. Please arrive 30 minutes prior to your scheduled appointment at 9;52WU. Make certain not to have anything to eat or drink at least 6 hours prior to your test. Should this appointment date or time not work well for you, please call radiology scheduling at [phone].  _____________________________________________________________________ hepatobiliary (HIDA) scan is an imaging procedure used to diagnose problems in the liver, gallbladder and bile ducts. In the HIDA scan, a radioactive chemical or tracer is injected into a vein in your arm. The tracer is handled by the liver like bile. Bile is a fluid produced and excreted by your liver that helps your digestive system break down fats in the foods you eat. Bile is stored in your gallbladder and the gallbladder releases the bile when you eat a meal. A special nuclear medicine scanner (gamma camera) tracks the flow of the tracer from your liver into your gallbladder and small intestine.  During your HIDA scan  You'll be asked to change into a hospital gown before your HIDA scan begins. Your health care team will position you on a table, usually on your back. The radioactive tracer is then injected into a vein in your arm.The tracer travels through your bloodstream to your liver, where it's taken up by the bile-producing cells. The radioactive tracer travels with the bile from your liver into your gallbladder and through your bile ducts to your small intestine.You may feel some pressure while the radioactive tracer is injected into your vein. As you lie on the  table, a special gamma camera is positioned over your abdomen taking pictures of the tracer as it moves through your body. The gamma camera takes pictures continually for about an hour. You'll need to keep still during the HIDA scan. This can become uncomfortable, but you may find that you can lessen the discomfort by taking deep breaths and thinking about other things. Tell your health care team if you're uncomfortable. The radiologist will watch on a computer the progress of the radioactive tracer through your body. The HIDA scan may be stopped when the radioactive tracer is seen in the gallbladder and enters your small intestine. This typically takes about an hour. In some cases extra imaging will be performed if original images aren't satisfactory, if morphine is given to help visualize the gallbladder or if the medication CCK is given to look at the contraction of the gallbladder. This test typically takes 2 hours to complete. ________________________________________________________________________

## 2023-12-22 NOTE — Progress Notes (Signed)
 Chief Complaint: RUQ pain Primary GI MD: Dr. Bridgett Camps  HPI: Discussed the use of AI scribe software for clinical note transcription with the patient, who gave verbal consent to proceed.  History of Present Illness Kathryn Jordan is a 68 year old female with a history of gastric ulcer who presents with right upper quadrant abdominal pain.  She has been experiencing intermittent right upper quadrant abdominal pain since around 2015. The pain is sometimes associated with nausea, especially when severe, and is exacerbated by eating certain foods. No unintentional weight loss.  In 2015, she underwent an endoscopy for similar pain, which showed some redness in the small intestine, leading to an increased dose of antacid treatment. She is currently taking omeprazole 20 mg once daily, which provides some relief, but certain foods still trigger the pain.  She had a gallbladder workup in the past, including an ultrasound about two months ago, which showed no gallstones. She recalls a HIDA scan in 2015 to assess gallbladder function. She continues to experience pain in the gallbladder region, particularly after eating.  Her dietary habits include occasional consumption of spicy foods and salsas, but she avoids citrus and greasy foods.   PREVIOUS GI WORKUP   Colonoscopy 12/2022 - Two 3 to 4 mm polyps (tubular adenomas) in the ascending colon, removed with a cold snare. Resected and retrieved.  - Mild diverticulosis in the sigmoid colon.  - Melanosis in the colon.  - Small internal hemorrhoids. - repeat 7 years  EGD 2015 for RUQ and epigastric pain - normal esophagus - normal stomach - duodenal erythema  Surgical [P], gastric BX BENIGN GASTRIC MUCOSA. NO HELICOBACTER PYLORI, INTESTINAL METAPLASIA OR ACTIVE INFLAMMATION IDENTIFIED.  Past Medical History:  Diagnosis Date   Anemia    Anxiety    Arthritis    Cataract    bilateral sx   Depression    GERD (gastroesophageal reflux disease)     Hyperlipidemia    Hypertension    Osteopenia    Right knee pain    Seasonal allergies    Varicose veins     Past Surgical History:  Procedure Laterality Date   ABDOMINOPLASTY/PANNICULECTOMY WITH LIPOSUCTION  2019   BLEPHAROPLASTY Bilateral 2021   CATARACT EXTRACTION, BILATERAL Bilateral 2016   COLONOSCOPY      Current Outpatient Medications  Medication Sig Dispense Refill   amoxicillin  (AMOXIL ) 875 MG tablet Take 875 mg by mouth 2 (two) times daily.     Fluoxetine HCl, PMDD, 10 MG TABS Take 1.5 tablets by mouth daily.     hydrOXYzine (ATARAX/VISTARIL) 25 MG tablet Take 25 mg by mouth 2 (two) times daily.     omeprazole (PRILOSEC) 20 MG capsule Take 20 mg by mouth.     omeprazole (PRILOSEC) 40 MG capsule Take 1 capsule (40 mg total) by mouth daily. 30 capsule 3   albuterol (VENTOLIN HFA) 108 (90 Base) MCG/ACT inhaler Inhale 2 puffs into the lungs every 6 (six) hours as needed for wheezing or shortness of breath. (Patient not taking: Reported on 12/25/2022)     B Complex Vitamins (VITAMIN B COMPLEX PO) Take 1 tablet by mouth daily at 6 (six) AM. (Patient not taking: Reported on 12/22/2023)     cetirizine (ZYRTEC) 10 MG tablet Take 10 mg by mouth daily as needed for allergies or rhinitis. (Patient not taking: Reported on 12/22/2023)     cyclobenzaprine (FLEXERIL) 10 MG tablet Take 10 mg by mouth 3 (three) times daily as needed for muscle spasms. (Patient not  taking: Reported on 12/22/2023)     diclofenac  (VOLTAREN ) 75 MG EC tablet Take 75 mg by mouth 2 (two) times daily. (Patient not taking: Reported on 12/22/2023)     diclofenac  Sodium (VOLTAREN ) 1 % GEL Apply 2 g topically 4 (four) times daily as needed (ARTHRITIS). (Patient not taking: Reported on 12/22/2023)     RESTASIS 0.05 % ophthalmic emulsion  (Patient not taking: Reported on 12/22/2023)     Vitamins-Lipotropics (MEGA MULTIPLE/CHELATED MINERAL) TABS Take 1 tablet by mouth daily at 8 pm. (Patient not taking: Reported on 12/22/2023)      No current facility-administered medications for this visit.    Allergies as of 12/22/2023 - Review Complete 12/22/2023  Allergen Reaction Noted   Meloxicam  01/17/2013    Family History  Problem Relation Age of Onset   Heart attack Mother    Heart disease Mother    Liver disease Father    Diabetes Sister    Diabetes Brother    Hyperlipidemia Neg Hx    Hypertension Neg Hx    Sudden death Neg Hx    Colon cancer Neg Hx    Esophageal cancer Neg Hx    Stomach cancer Neg Hx    Colon polyps Neg Hx    Rectal cancer Neg Hx     Social History   Socioeconomic History   Marital status: Married    Spouse name: Not on file   Number of children: Not on file   Years of education: Not on file   Highest education level: Not on file  Occupational History   Not on file  Tobacco Use   Smoking status: Never   Smokeless tobacco: Never  Vaping Use   Vaping status: Never Used  Substance and Sexual Activity   Alcohol use: Yes    Alcohol/week: 0.0 - 3.0 standard drinks of alcohol    Comment: occasional   Drug use: Not Currently    Types: Marijuana   Sexual activity: Yes    Birth control/protection: None    Comment: INTERCOURSE AGE 17,LESS THAN 5 SEXUAL PARTNERS,DES UNKNOWN  Other Topics Concern   Not on file  Social History Narrative   Not on file   Social Drivers of Health   Financial Resource Strain: Not on file  Food Insecurity: Not on file  Transportation Needs: Not on file  Physical Activity: Not on file  Stress: Not on file  Social Connections: Not on file  Intimate Partner Violence: Not on file    Review of Systems:    Constitutional: No weight loss, fever, chills, weakness or fatigue HEENT: Eyes: No change in vision               Ears, Nose, Throat:  No change in hearing or congestion Skin: No rash or itching Cardiovascular: No chest pain, chest pressure or palpitations   Respiratory: No SOB or cough Gastrointestinal: See HPI and otherwise  negative Genitourinary: No dysuria or change in urinary frequency Neurological: No headache, dizziness or syncope Musculoskeletal: No new muscle or joint pain Hematologic: No bleeding or bruising Psychiatric: No history of depression or anxiety    Physical Exam:  Vital signs: BP 126/62   Pulse (!) 51   Ht 5\' 4"  (1.626 m)   Wt 159 lb (72.1 kg)   BMI 27.29 kg/m   Constitutional: NAD, alert and cooperative Head:  Normocephalic and atraumatic. Eyes:   PEERL, EOMI. No icterus. Conjunctiva pink. Respiratory: Respirations even and unlabored. Lungs clear to auscultation bilaterally.  No wheezes, crackles, or rhonchi.  Cardiovascular:  Regular rate and rhythm. No peripheral edema, cyanosis or pallor.  Gastrointestinal:  Soft, nondistended, RUQ tenderness on deep palpation. No rebound or guarding. Normal bowel sounds. No appreciable masses or hepatomegaly. Rectal:  Declines Msk:  Symmetrical without gross deformities. Without edema, no deformity or joint abnormality.  Neurologic:  Alert and  oriented x4;  grossly normal neurologically.  Skin:   Dry and intact without significant lesions or rashes. Psychiatric: Oriented to person, place and time. Demonstrates good judgement and reason without abnormal affect or behaviors.  RELEVANT LABS AND IMAGING: CBC    Component Value Date/Time   WBC 8.4 02/07/2008 2026   RBC 4.26 02/07/2008 2026   HGB 13.4 02/07/2008 2026   HCT 38.3 02/07/2008 2026   PLT 182 02/07/2008 2026   MCV 90.0 02/07/2008 2026   MCHC 34.8 02/07/2008 2026   RDW 11.6 02/07/2008 2026   LYMPHSABS 1.8 02/07/2008 2026   MONOABS 0.5 02/07/2008 2026   EOSABS 0.2 02/07/2008 2026   BASOSABS 0.0 02/07/2008 2026    CMP     Component Value Date/Time   NA 141 02/07/2008 2037   K 3.6 02/07/2008 2037   CL 103 02/07/2008 2037   CO2 29 02/07/2008 2037   GLUCOSE 136 (H) 02/07/2008 2037   BUN 14 02/07/2008 2037   CREATININE 0.6 02/07/2008 2037   CALCIUM 9.5 02/07/2008 2037    PROT 7.2 02/07/2008 2037   ALBUMIN 4.3 02/07/2008 2037   AST 27 02/07/2008 2037   ALT 21 02/07/2008 2037   ALKPHOS 76 02/07/2008 2037   BILITOT 0.3 02/07/2008 2037   GFRNONAA >60 02/07/2008 2037   GFRAA  02/07/2008 2037    >60        The eGFR has been calculated using the MDRD equation. This calculation has not been validated in all clinical     Assessment/Plan:   History of colon polyps Colonoscopy 2024 with two small tubular adenomas and repeat 7 years  RUQ pain EGD 2015 for epigastric/RUQ pain with normal stomach/esophagus and erythema in duodenum with negative biopsies. Normal LFTs. Normal CBC/lipase. HIDA 2015 with EF 52%. US  abdomen 09/2023 without gallstones. RUQ pain worse with eating. Previously improved with antacid. Tenderness on exam With RUQ pain worse after eating and tenderness on exam suspect biliary dyskinesia.  However, previous mention of improvement on antacid so we can increase her antacid as a trial - Omeprazole 40 Mg once daily - Educated patient on lifestyle modifications and provided patient education handout - HIDA scan to evaluate for biliary dyskinesia - Avoid fatty/greasy foods - CBC/CMP  Abundio Teuscher [facility] 12/22/2023, 9:38 AM  Cc: Smothers, Jeanelle Milch, NP

## 2023-12-23 ENCOUNTER — Ambulatory Visit: Payer: Self-pay | Admitting: Gastroenterology

## 2023-12-24 NOTE — Progress Notes (Signed)
 Addendum: Reviewed and agree with assessment and management plan. Asha Grumbine, Carie Caddy, MD

## 2023-12-31 NOTE — Progress Notes (Signed)
 CC: Pelvic prolapse Subjective:    Kathryn Jordan is a 68 y.o. y/o [id number] who presents today for pelvic prolapse No LMP recorded. Patient is postmenopausal.   ______________________________________________________________________  Past Surgical History:  Procedure Laterality Date  . ABDOMINOPLASTY    . BELPHAROPTOSIS REPAIR     Procedure: BELPHAROPTOSIS REPAIR    Allergies  Allergen Reactions  . Cyclobenzaprine Rash  . Meloxicam Rash    Past Medical History:  Diagnosis Date  . Abnormal Pap smear of cervix   . Bilateral knee pain   . Dry eyes   . Dysthymia   . 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 Enthesopathy of ankle and tarsus   . HPV (human papilloma virus) infection   . Osteoarthrosis   . Postmenopausal atrophic vaginitis   . Spider veins of both lower extremities     OB History  Gravida Para Term Preterm AB Living  3 3 3  0 0 3  SAB IAB Ectopic Molar Multiple Live Births  0 0 0 0 0 3    # Outcome Date GA Lbr Len/2nd Weight Sex Type Anes PTL Lv  3 Term         LIV  2 Term         LIV  1 Term         LIV     Family History  Problem Relation Name Age of Onset  . Heart disease Mother    . Heart attack Mother    . Liver disease Father    . Diabetes Sister    . Diabetes Brother    . Diabetes Brother    . Cancer Neg Hx    . Stroke Neg Hx    . Breast cancer Neg Hx    . Ovarian cancer Neg Hx    . Colon cancer Neg Hx      Social History   Socioeconomic History  . Marital status: Married    Spouse name: Not on file  . Number of children: Not on file  . Years of education: Not on file  . Highest education level: Not on file  Occupational History  . Not on file  Tobacco Use  . Smoking status: Former    Passive exposure: Never  . Smokeless tobacco: Never  Vaping Use  . Vaping status: Never Used  Substance and Sexual Activity  . Alcohol use: Yes    Comment: holidays  . Drug use: No  .  Sexual activity: Not Currently    Birth control/protection: Post-menopausal  Other Topics Concern  . Not on file  Social History Narrative  . Not on file   Social Drivers of Health   Food Insecurity: Not on file  Transportation Needs: Not on file  Safety: Not on file  Living Situation: Not on file     Current Outpatient Medications:  .  FLUoxetine (PROzac) 10 mg tablet, Take 3 tablets (30 mg total) by mouth every morning Indications: major depressive disorder., Disp: 90 tablet, Rfl: 5 .  hydrOXYzine (ATARAX) 25 mg tablet, Take 1 tablet (25 mg total) by mouth 2 (two) times a day as needed for anxiety Indications: anxious., Disp: 30 tablet, Rfl: 11 .  omeprazole  (PriLOSEC) 20 mg DR capsule, Take 1 capsule (20 mg total) by mouth in the morning., Disp: 30 capsule, Rfl: 0        Review Of System: A  review of systems was obtained and  is negative , except for as stated   Objective:   PHYSICAL EXAM: BP 126/68   Ht 1.562 m (5' 1.5)   Wt 71.7 kg (158 lb)   BMI 29.37 kg/m  Constitutional: Well-developed, well-nourished female in no acute distress Neurological: Alert and oriented to person, place, and time, affect appropriate Psychiatric: Appropriate mood and affect. . Non-disheveled appearance.  Skin: No rashes or lesions, No suspicious lesions, masses, or ulcers.  Neck: Supple without masses. Trachea is midline.Thyroid is normal size without masses,  Lymphatics: No cervical, axillary, supraclavicular, or inguinal adenopathy noted Respiratory: Clear to auscultation bilaterally. Good air movement with normal work of  breathing. Cardiovascular: Regular rate and rhythm. Extremities grossly normal, nontender with no edema; pulses regular Gastrointestinal: Soft, nontender, nondistended. No masses or hernias appreciated. No hepatosplenomegaly. No fluid wave. No rebound or guarding. Breast Exam: Appear normal and symmetric without palpable masses, skin changes or nipple inversion.  No  discharge, rash, or skin retraction. No palpable lymph nodes. No tenderness, masses, or nipple abnormality Genitourinary:         External Genitalia: Normal female genitalia w/o masses, lesions, rashes    Urethral Meatus: Normal caliber and position    Urethra: Midline, no massesA chaperone was present for the exam.      Bladder: Well-suspended, NT    Vagina: mucosa without lesions or ulcers     Cervix: No lesions, normal size and consistency; no cervical motion tenderness     Uterus: Normal size and contour; smooth, mobile, NT Adnexa/Parametria: No masses; no parametrial nodularity; no tenderness Perineum/Anus: No lesions, no thrombosed hemorids,  A chaperone was present for the exam.   HPI :  Assessment/    Plan:  Kathryn Jordan is a 68 y.o. female [id number]   1.  Routine wellness issues discussed  --Labs reviewed. .  Vitamin D done 01/13/2023 and was normal at 57 .  She did have a positive urine culture 12/19/2022 was positive for E. coli   2 Postmenopausal Denies any vaginal bleeding or spotting GYN ultrasound 2019 Small atrophic uterus 6.2 cm No fibroids or masses thin symmetric endometrium at 3 mm Small atrophic ovaries   History of abnormal Pap--ASCUS but negative HPV Pap smear updated today  Genitourinary syndrome menopause--vaginal atrophy Presently asymptomatic   3 No family history of breast ovarian uterine colon cancer  Mammogram done 03/31/2023--stable calcifications recommendations repeat in 1 year  Colonoscopy was done 12/25/2022   4 Pelvic prolapse Vaginal bulge Grade 2 cystocele without stress urinary incontinence Good support to the vaginal apex and uterus Good support to the posterior floor  I have personally spent   greater than 30 minutes involved in face-to-face and nonface-to-face activities for this patient on the day of the visit.SABRA Professional time spent includes  reviewing the chart, reviewing the laboratory results, and radiology results,  and ultrasound results and images if pertinent. In addition to counseling the patient to the proposed plan.    5 Bone density done 10/06/2022--osteopenia -1.7 standard deviations of the spine  -0.6 standard deviations of the hip FRAX data was 7.3 and 0.4% respectively Continue adequate vitamin D dietary calcium and weightbearing exercise Repeat bone density next year

## 2024-01-06 ENCOUNTER — Ambulatory Visit (HOSPITAL_COMMUNITY)
Admission: RE | Admit: 2024-01-06 | Discharge: 2024-01-06 | Disposition: A | Discharge status: Home / Self Care | Source: Ambulatory Visit | Attending: Gastroenterology | Admitting: Gastroenterology

## 2024-01-06 DIAGNOSIS — R1011 Right upper quadrant pain: Secondary | ICD-10-CM | POA: Insufficient documentation

## 2024-01-06 MED ORDER — TECHNETIUM TC 99M MEBROFENIN IV KIT
5.4700 | PACK | Freq: Once | INTRAVENOUS | Status: AC
  Administered 2024-01-06: 5.47 via INTRAVENOUS

## 2024-01-20 NOTE — Telephone Encounter (Signed)
 Per [facility], they have attempted to contact patient to schedule appointment 01/15/24. Patient has not yet returned their call.

## 2024-02-24 ENCOUNTER — Ambulatory Visit: Payer: Self-pay | Admitting: General Surgery

## 2024-02-29 NOTE — Patient Instructions (Addendum)
 SURGICAL WAITING ROOM VISITATION Patients having surgery or a procedure may have no more than 2 support people in the waiting area - these visitors may rotate.    Children under the age of 26 must have an adult with them who is not the patient.  If the patient needs to stay at the hospital during part of their recovery, the visitor guidelines for inpatient rooms apply. Pre-op nurse will coordinate an appropriate time for 1 support person to accompany patient in pre-op.  This support person may not rotate.    Please refer to the [facility] website for the visitor guidelines for Inpatients (after your surgery is over and you are in a regular room).       Your procedure is scheduled on: 03-04-24   Report to [facility] Main Entrance    Report to admitting at 7:45 AM   Call this number if you have problems the morning of surgery [phone]   Do not eat food or drink liquids :After Midnight.          If you have questions, please contact your surgeon's office.  FOLLOW  ANY ADDITIONAL PRE OP INSTRUCTIONS YOU RECEIVED FROM YOUR SURGEON'S OFFICE!!!     Oral Hygiene is also important to reduce your risk of infection.                                    Remember - BRUSH YOUR TEETH THE MORNING OF SURGERY WITH YOUR REGULAR TOOTHPASTE   Do NOT smoke after Midnight   Take these medicines the morning of surgery with A SIP OF WATER:    Fluoxetine   Hydroxyzine   Omeprazole    Zyrtec   Eyedrops if needed  Stop all vitamins and herbal supplements 7 days before surgery  Bring CPAP mask and tubing day of surgery.                              You may not have any metal on your body including hair pins, jewelry, and body piercing             Do not wear make-up, lotions, powders, perfumes, or deodorant  Do not wear nail polish including gel and S&S, artificial/acrylic nails, or any other type of covering on natural nails including finger and toenails. If you have artificial nails, gel  coating, etc. that needs to be removed by a nail salon please have this removed prior to surgery or surgery may need to be canceled/ delayed if the surgeon/ anesthesia feels like they are unable to be safely monitored.   Do not shave  48 hours prior to surgery.    Do not bring valuables to the hospital. [facility] IS NOT RESPONSIBLE   FOR VALUABLES.   Contacts, dentures or bridgework may not be worn into surgery.  DO NOT BRING YOUR HOME MEDICATIONS TO THE HOSPITAL. PHARMACY WILL DISPENSE MEDICATIONS LISTED ON YOUR MEDICATION LIST TO YOU DURING YOUR ADMISSION IN THE HOSPITAL!    Patients discharged on the day of surgery will not be allowed to drive home.  Someone NEEDS to stay with you for the first 24 hours after anesthesia.   Special Instructions: Bring a copy of your healthcare power of attorney and living will documents the day of surgery if you haven't scanned them before.  Please read over the following fact sheets you were given: IF YOU HAVE QUESTIONS ABOUT YOUR PRE-OP INSTRUCTIONS PLEASE CALL [phone] [facility]  If you received a COVID test during your pre-op visit  it is requested that you wear a mask when out in public, stay away from anyone that may not be feeling well and notify your surgeon if you develop symptoms. If you test positive for Covid or have been in contact with anyone that has tested positive in the last 10 days please notify you surgeon.  [facility] - Preparing for Surgery Before surgery, you can play an important role.  Because skin is not sterile, your skin needs to be as free of germs as possible.  You can reduce the number of germs on your skin by washing with CHG (chlorahexidine gluconate) soap before surgery.  CHG is an antiseptic cleaner which kills germs and bonds with the skin to continue killing germs even after washing. Please DO NOT use if you have an allergy to CHG or antibacterial soaps.  If your skin becomes reddened/irritated stop using the  CHG and inform your nurse when you arrive at Short Stay. Do not shave (including legs and underarms) for at least 48 hours prior to the first CHG shower.  You may shave your face/neck.  Please follow these instructions carefully:  1.  Shower with CHG Soap the night before surgery and the  morning of surgery.  2.  If you choose to wash your hair, wash your hair first as usual with your normal  shampoo.  3.  After you shampoo, rinse your hair and body thoroughly to remove the shampoo.                             4.  Use CHG as you would any other liquid soap.  You can apply chg directly to the skin and wash.  Gently with a scrungie or clean washcloth.  5.  Apply the CHG Soap to your body ONLY FROM THE NECK DOWN.   Do   not use on face/ open                           Wound or open sores. Avoid contact with eyes, ears mouth and   genitals (private parts).                       Wash face,  Genitals (private parts) with your normal soap.             6.  Wash thoroughly, paying special attention to the area where your    surgery  will be performed.  7.  Thoroughly rinse your body with warm water from the neck down.  8.  DO NOT shower/wash with your normal soap after using and rinsing off the CHG Soap.                9.  Pat yourself dry with a clean towel.            10.  Wear clean pajamas.            11.  Place clean sheets on your bed the night of your first shower and do not  sleep with pets. Day of Surgery : Do not apply any lotions/deodorants the morning of surgery.  Please wear clean clothes to the hospital/surgery center.  FAILURE TO  FOLLOW THESE INSTRUCTIONS MAY RESULT IN THE CANCELLATION OF YOUR SURGERY  PATIENT SIGNATURE_________________________________  NURSE SIGNATURE__________________________________  ________________________________________________________________________

## 2024-03-02 ENCOUNTER — Other Ambulatory Visit: Payer: Self-pay

## 2024-03-02 ENCOUNTER — Encounter (HOSPITAL_COMMUNITY): Payer: Self-pay

## 2024-03-02 ENCOUNTER — Encounter (HOSPITAL_COMMUNITY)
Admission: RE | Admit: 2024-03-02 | Discharge: 2024-03-02 | Disposition: A | Discharge status: Home / Self Care | Source: Ambulatory Visit | Attending: General Surgery | Admitting: General Surgery

## 2024-03-02 VITALS — BP 145/59 | HR 60 | Temp 98.6°F | Resp 12 | Ht 63.0 in | Wt 158.2 lb

## 2024-03-02 DIAGNOSIS — Z01818 Encounter for other preprocedural examination: Secondary | ICD-10-CM | POA: Insufficient documentation

## 2024-03-02 DIAGNOSIS — I10 Essential (primary) hypertension: Secondary | ICD-10-CM | POA: Insufficient documentation

## 2024-03-02 LAB — CBC
HCT: 39.2 % (ref 36.0–46.0)
Hemoglobin: 13.3 g/dL (ref 12.0–15.0)
MCH: 30.4 pg (ref 26.0–34.0)
MCHC: 33.9 g/dL (ref 30.0–36.0)
MCV: 89.5 fL (ref 80.0–100.0)
Platelets: 212 K/uL (ref 150–400)
RBC: 4.38 MIL/uL (ref 3.87–5.11)
RDW: 13.8 % (ref 11.5–15.5)
WBC: 6.7 K/uL (ref 4.0–10.5)
nRBC: 0 % (ref 0.0–0.2)

## 2024-03-02 LAB — BASIC METABOLIC PANEL WITH GFR
Anion gap: 7 (ref 5–15)
BUN: 13 mg/dL (ref 8–23)
CO2: 26 mmol/L (ref 22–32)
Calcium: 9.2 mg/dL (ref 8.9–10.3)
Chloride: 105 mmol/L (ref 98–111)
Creatinine, Ser: 0.57 mg/dL (ref 0.44–1.00)
GFR, Estimated: >60 mL/min
Glucose, Bld: 95 mg/dL (ref 70–99)
Potassium: 4.2 mmol/L (ref 3.5–5.1)
Sodium: 138 mmol/L (ref 135–145)

## 2024-03-02 NOTE — Progress Notes (Signed)
 COVID Vaccine Completed:  Date of COVID positive in last 90 days:  No  PCP - Barnie Dyer, NP Cardiologist - N/A  Chest x-ray - N/A EKG - 03-02-24 Epic Stress Test -  N/A ECHO -  N/A Cardiac Cath -  N/A Pacemaker/ICD device last checked: N/A Spinal Cord Stimulator: N/A  Bowel Prep -  N/A  Sleep Study -  N/A CPAP -   Fasting Blood Sugar -  N/A Checks Blood Sugar _____ times a day  Last dose of GLP1 agonist-  N/A GLP1 instructions:  Do not take after     Last dose of SGLT-2 inhibitors-  N/A SGLT-2 instructions:  Do not take after     Blood Thinner Instructions:  N/A Aspirin Instructions: Last Dose:  Activity level:  Can go up a flight of stairs and perform activities of daily living without stopping and without symptoms of chest pain or shortness of breath.  Anesthesia review:  N/A  Patient denies shortness of breath, fever, cough and chest pain at PAT appointment  Patient verbalized understanding of instructions that were given to them at the PAT appointment. Patient was also instructed that they will need to review over the PAT instructions again at home before surgery.

## 2024-03-03 NOTE — Anesthesia Preprocedure Evaluation (Signed)
 Anesthesia Evaluation  Patient identified by MRN, date of birth, ID band Patient awake    Reviewed: Allergy & Precautions, H&P , NPO status , Patient's Chart, lab work & pertinent test results  History of Anesthesia Complications Negative for: history of anesthetic complications  Airway Mallampati: II  TM Distance: >3 FB Neck ROM: Full    Dental no notable dental hx.    Pulmonary neg pulmonary ROS, neg sleep apnea   Pulmonary exam normal breath sounds clear to auscultation       Cardiovascular hypertension, (-) Past MI Normal cardiovascular exam Rhythm:Regular Rate:Normal     Neuro/Psych  PSYCHIATRIC DISORDERS Anxiety Depression    negative neurological ROS     GI/Hepatic Neg liver ROS,GERD  ,,Biliary dyskinesia   Endo/Other  negative endocrine ROS    Renal/GU negative Renal ROS  negative genitourinary   Musculoskeletal  (+) Arthritis ,    Abdominal   Peds negative pediatric ROS (+)  Hematology negative hematology ROS (+)   Anesthesia Other Findings   Reproductive/Obstetrics negative OB ROS                              Anesthesia Physical Anesthesia Plan  ASA: 2  Anesthesia Plan: General   Post-op Pain Management: Tylenol  PO (pre-op)*   Induction: Intravenous  PONV Risk Score and Plan: 3 and Dexamethasone, Ondansetron  and Treatment may vary due to age or medical condition  Airway Management Planned: Oral ETT  Additional Equipment: None  Intra-op Plan:   Post-operative Plan: Extubation in OR  Informed Consent: I have reviewed the patients History and Physical, chart, labs and discussed the procedure including the risks, benefits and alternatives for the proposed anesthesia with the patient or authorized representative who has indicated his/her understanding and acceptance.     Dental advisory given  Plan Discussed with: CRNA  Anesthesia Plan Comments:           Anesthesia Quick Evaluation

## 2024-03-04 ENCOUNTER — Encounter (HOSPITAL_COMMUNITY)
Admission: RE | Disposition: A | Discharge status: Home / Self Care | Payer: Self-pay | Source: Home / Self Care | Attending: General Surgery

## 2024-03-04 ENCOUNTER — Other Ambulatory Visit: Payer: Self-pay

## 2024-03-04 ENCOUNTER — Encounter (HOSPITAL_COMMUNITY): Payer: Self-pay | Admitting: General Surgery

## 2024-03-04 ENCOUNTER — Ambulatory Visit (HOSPITAL_COMMUNITY)
Admission: RE | Admit: 2024-03-04 | Discharge: 2024-03-04 | Disposition: A | Discharge status: Home / Self Care | Attending: General Surgery | Admitting: General Surgery

## 2024-03-04 ENCOUNTER — Ambulatory Visit (HOSPITAL_COMMUNITY): Payer: Self-pay | Admitting: Anesthesiology

## 2024-03-04 ENCOUNTER — Encounter (HOSPITAL_COMMUNITY): Payer: Self-pay | Admitting: Medical

## 2024-03-04 DIAGNOSIS — K219 Gastro-esophageal reflux disease without esophagitis: Secondary | ICD-10-CM | POA: Diagnosis not present

## 2024-03-04 DIAGNOSIS — F32A Depression, unspecified: Secondary | ICD-10-CM | POA: Insufficient documentation

## 2024-03-04 DIAGNOSIS — K811 Chronic cholecystitis: Secondary | ICD-10-CM | POA: Diagnosis not present

## 2024-03-04 DIAGNOSIS — K828 Other specified diseases of gallbladder: Secondary | ICD-10-CM | POA: Diagnosis present

## 2024-03-04 DIAGNOSIS — I10 Essential (primary) hypertension: Secondary | ICD-10-CM | POA: Insufficient documentation

## 2024-03-04 DIAGNOSIS — F419 Anxiety disorder, unspecified: Secondary | ICD-10-CM | POA: Insufficient documentation

## 2024-03-04 DIAGNOSIS — M1990 Unspecified osteoarthritis, unspecified site: Secondary | ICD-10-CM | POA: Diagnosis not present

## 2024-03-04 HISTORY — PX: CHOLECYSTECTOMY: SHX55

## 2024-03-04 SURGERY — LAPAROSCOPIC CHOLECYSTECTOMY
Anesthesia: General | Site: Abdomen

## 2024-03-04 MED ORDER — FENTANYL CITRATE (PF) 100 MCG/2ML IJ SOLN
INTRAMUSCULAR | Status: DC | PRN
  Administered 2024-03-04: 100 ug via INTRAVENOUS

## 2024-03-04 MED ORDER — FENTANYL CITRATE PF 50 MCG/ML IJ SOSY
25.0000 ug | PREFILLED_SYRINGE | INTRAMUSCULAR | Status: DC | PRN
  Administered 2024-03-04: 50 ug via INTRAVENOUS

## 2024-03-04 MED ORDER — 0.9 % SODIUM CHLORIDE (POUR BTL) OPTIME
TOPICAL | Status: DC | PRN
Start: 2024-03-04 — End: 2024-03-04
  Administered 2024-03-04: 1000 mL

## 2024-03-04 MED ORDER — DEXAMETHASONE SODIUM PHOSPHATE 10 MG/ML IJ SOLN
INTRAMUSCULAR | Status: DC | PRN
  Administered 2024-03-04: 8 mg via INTRAVENOUS

## 2024-03-04 MED ORDER — PHENYLEPHRINE 80 MCG/ML (10ML) SYRINGE FOR IV PUSH (FOR BLOOD PRESSURE SUPPORT)
PREFILLED_SYRINGE | INTRAVENOUS | Status: DC | PRN
  Administered 2024-03-04: 80 ug via INTRAVENOUS

## 2024-03-04 MED ORDER — ORAL CARE MOUTH RINSE: 15.0000 mL | Freq: Once | OROMUCOSAL | Status: AC

## 2024-03-04 MED ORDER — KETOROLAC TROMETHAMINE 15 MG/ML IJ SOLN
15.0000 mg | Freq: Once | INTRAMUSCULAR | Status: AC
  Administered 2024-03-04: 15 mg via INTRAVENOUS

## 2024-03-04 MED ORDER — OXYCODONE HCL 5 MG/5ML PO SOLN: 5.0000 mg | Freq: Once | ORAL | Status: DC | PRN

## 2024-03-04 MED ORDER — INDOCYANINE GREEN 25 MG IV SOLR
1.2500 mg | Freq: Once | INTRAVENOUS | Status: AC
  Administered 2024-03-04: 1.25 mg via INTRAVENOUS

## 2024-03-04 MED ORDER — ACETAMINOPHEN 500 MG PO TABS
1000.0000 mg | ORAL_TABLET | ORAL | Status: AC
  Administered 2024-03-04: 1000 mg via ORAL
  Filled 2024-03-04: qty 2

## 2024-03-04 MED ORDER — CHLORHEXIDINE GLUCONATE 0.12 % MT SOLN
15.0000 mL | Freq: Once | OROMUCOSAL | Status: AC
  Administered 2024-03-04: 15 mL via OROMUCOSAL

## 2024-03-04 MED ORDER — ONDANSETRON HCL 4 MG/2ML IJ SOLN
INTRAMUSCULAR | Status: AC
  Filled 2024-03-04: qty 2

## 2024-03-04 MED ORDER — CHLORHEXIDINE GLUCONATE CLOTH 2 % EX PADS: 6.0000 | MEDICATED_PAD | Freq: Once | CUTANEOUS | Status: DC

## 2024-03-04 MED ORDER — AMISULPRIDE (ANTIEMETIC) 5 MG/2ML IV SOLN
10.0000 mg | Freq: Once | INTRAVENOUS | Status: AC | PRN
  Administered 2024-03-04: 10 mg via INTRAVENOUS

## 2024-03-04 MED ORDER — KETOROLAC TROMETHAMINE 15 MG/ML IJ SOLN
INTRAMUSCULAR | Status: AC
  Filled 2024-03-04: qty 1

## 2024-03-04 MED ORDER — BUPIVACAINE-EPINEPHRINE 0.25% -1:200000 IJ SOLN
INTRAMUSCULAR | Status: DC | PRN
  Administered 2024-03-04: 30 mL

## 2024-03-04 MED ORDER — ENOXAPARIN SODIUM 40 MG/0.4ML IJ SOSY
40.0000 mg | PREFILLED_SYRINGE | Freq: Once | INTRAMUSCULAR | Status: AC
  Administered 2024-03-04: 40 mg via SUBCUTANEOUS
  Filled 2024-03-04: qty 0.4

## 2024-03-04 MED ORDER — ACETAMINOPHEN 500 MG PO TABS: 1000.0000 mg | ORAL_TABLET | Freq: Once | ORAL | Status: DC

## 2024-03-04 MED ORDER — ROCURONIUM BROMIDE 100 MG/10ML IV SOLN
INTRAVENOUS | Status: DC | PRN
  Administered 2024-03-04: 50 mg via INTRAVENOUS

## 2024-03-04 MED ORDER — PROPOFOL 10 MG/ML IV BOLUS
INTRAVENOUS | Status: DC | PRN
  Administered 2024-03-04: 120 mg via INTRAVENOUS

## 2024-03-04 MED ORDER — DEXMEDETOMIDINE HCL IN NACL 80 MCG/20ML IV SOLN
INTRAVENOUS | Status: AC
  Filled 2024-03-04: qty 20

## 2024-03-04 MED ORDER — CEFAZOLIN SODIUM-DEXTROSE 2-4 GM/100ML-% IV SOLN
2.0000 g | INTRAVENOUS | Status: AC
  Administered 2024-03-04: 2 g via INTRAVENOUS
  Filled 2024-03-04: qty 100

## 2024-03-04 MED ORDER — SUGAMMADEX SODIUM 200 MG/2ML IV SOLN
INTRAVENOUS | Status: DC | PRN
  Administered 2024-03-04: 200 mg via INTRAVENOUS

## 2024-03-04 MED ORDER — DEXAMETHASONE SODIUM PHOSPHATE 10 MG/ML IJ SOLN
INTRAMUSCULAR | Status: AC
  Filled 2024-03-04: qty 1

## 2024-03-04 MED ORDER — ONDANSETRON HCL 4 MG/2ML IJ SOLN
INTRAMUSCULAR | Status: DC | PRN
  Administered 2024-03-04: 4 mg via INTRAVENOUS

## 2024-03-04 MED ORDER — FENTANYL CITRATE PF 50 MCG/ML IJ SOSY
PREFILLED_SYRINGE | INTRAMUSCULAR | Status: AC
Start: 2024-03-04 — End: 2024-03-04
  Filled 2024-03-04: qty 1

## 2024-03-04 MED ORDER — MIDAZOLAM HCL 2 MG/2ML IJ SOLN
INTRAMUSCULAR | Status: AC
  Filled 2024-03-04: qty 2

## 2024-03-04 MED ORDER — AMISULPRIDE (ANTIEMETIC) 5 MG/2ML IV SOLN
INTRAVENOUS | Status: AC
  Filled 2024-03-04: qty 4

## 2024-03-04 MED ORDER — SUGAMMADEX SODIUM 200 MG/2ML IV SOLN
INTRAVENOUS | Status: AC
  Filled 2024-03-04: qty 4

## 2024-03-04 MED ORDER — ROCURONIUM BROMIDE 10 MG/ML (PF) SYRINGE
PREFILLED_SYRINGE | INTRAVENOUS | Status: AC
  Filled 2024-03-04: qty 10

## 2024-03-04 MED ORDER — LIDOCAINE HCL (PF) 2 % IJ SOLN
INTRAMUSCULAR | Status: AC
  Filled 2024-03-04: qty 5

## 2024-03-04 MED ORDER — OXYCODONE HCL 5 MG PO TABS: 5.0000 mg | ORAL_TABLET | ORAL | 0 refills | Status: AC | PRN

## 2024-03-04 MED ORDER — OXYCODONE HCL 5 MG PO TABS
ORAL_TABLET | ORAL | Status: AC
Start: 2024-03-04 — End: 2024-03-04
  Filled 2024-03-04: qty 1

## 2024-03-04 MED ORDER — SUGAMMADEX SODIUM 200 MG/2ML IV SOLN
INTRAVENOUS | Status: AC
  Filled 2024-03-04: qty 2

## 2024-03-04 MED ORDER — LACTATED RINGERS IV SOLN: INTRAVENOUS | Status: DC

## 2024-03-04 MED ORDER — STERILE WATER FOR IRRIGATION IR SOLN
Status: DC | PRN
Start: 2024-03-04 — End: 2024-03-04
  Administered 2024-03-04: 1000 mL

## 2024-03-04 MED ORDER — GABAPENTIN 300 MG PO CAPS
300.0000 mg | ORAL_CAPSULE | ORAL | Status: AC
  Administered 2024-03-04: 300 mg via ORAL
  Filled 2024-03-04: qty 1

## 2024-03-04 MED ORDER — PROPOFOL 10 MG/ML IV BOLUS
INTRAVENOUS | Status: AC
  Filled 2024-03-04: qty 20

## 2024-03-04 MED ORDER — FENTANYL CITRATE (PF) 100 MCG/2ML IJ SOLN
INTRAMUSCULAR | Status: AC
  Filled 2024-03-04: qty 2

## 2024-03-04 MED ORDER — OXYCODONE HCL 5 MG PO TABS: 5.0000 mg | ORAL_TABLET | Freq: Once | ORAL | Status: DC | PRN

## 2024-03-04 MED ORDER — BUPIVACAINE-EPINEPHRINE (PF) 0.25% -1:200000 IJ SOLN
INTRAMUSCULAR | Status: AC
Start: 2024-03-04 — End: 2024-03-04
  Filled 2024-03-04: qty 30

## 2024-03-04 MED ORDER — LACTATED RINGERS IR SOLN
Status: DC | PRN
  Administered 2024-03-04: 1000 mL

## 2024-03-04 MED ORDER — LIDOCAINE HCL (CARDIAC) PF 100 MG/5ML IV SOSY
PREFILLED_SYRINGE | INTRAVENOUS | Status: DC | PRN
  Administered 2024-03-04: 80 mg via INTRAVENOUS

## 2024-03-04 MED ORDER — MIDAZOLAM HCL 5 MG/5ML IJ SOLN
INTRAMUSCULAR | Status: DC | PRN
  Administered 2024-03-04: 2 mg via INTRAVENOUS

## 2024-03-04 MED ORDER — GLYCOPYRROLATE 0.2 MG/ML IJ SOLN
INTRAMUSCULAR | Status: AC
  Filled 2024-03-04: qty 1

## 2024-03-04 SURGICAL SUPPLY — 35 items
BAG COUNTER SPONGE SURGICOUNT (BAG) ×2 IMPLANT
CHLORAPREP W/TINT 26 (MISCELLANEOUS) ×2 IMPLANT
CLIP LIGATING HEMO O LOK GREEN (MISCELLANEOUS) ×2 IMPLANT
CNTNR URN SCR LID CUP LEK RST (MISCELLANEOUS) IMPLANT
COVER MAYO STAND XLG (MISCELLANEOUS) IMPLANT
COVER SURGICAL LIGHT HANDLE (MISCELLANEOUS) ×2 IMPLANT
DERMABOND ADVANCED .7 DNX12 (GAUZE/BANDAGES/DRESSINGS) ×2 IMPLANT
DRAPE C-ARM 42X120 X-RAY (DRAPES) IMPLANT
ELECT REM PT RETURN 15FT ADLT (MISCELLANEOUS) ×2 IMPLANT
GLOVE BIOGEL PI IND STRL 6.5 (GLOVE) IMPLANT
GLOVE INDICATOR 6.5 STRL GRN (GLOVE) ×2 IMPLANT
GLOVE SURG MICRO LTX SZ6 (GLOVE) ×2 IMPLANT
GOWN STRL REUS W/ TWL LRG LVL3 (GOWN DISPOSABLE) ×2 IMPLANT
GRASPER SUT TROCAR 14GX15 (MISCELLANEOUS) ×2 IMPLANT
IRRIGATION SUCT STRKRFLW 2 WTP (MISCELLANEOUS) ×2 IMPLANT
KIT BASIN OR (CUSTOM PROCEDURE TRAY) ×2 IMPLANT
KIT IMAGING PINPOINTPAQ (MISCELLANEOUS) IMPLANT
KIT TURNOVER KIT A (KITS) ×2 IMPLANT
LHOOK LAP DISP 36CM (ELECTROSURGICAL) ×2 IMPLANT
NDL INSUFFLATION 14GA 120MM (NEEDLE) ×2 IMPLANT
NEEDLE INSUFFLATION 14GA 120MM (NEEDLE) ×1 IMPLANT
NS IRRIG 1000ML POUR BTL (IV SOLUTION) ×2 IMPLANT
PENCIL BUTTON HOLSTER BLD 10FT (ELECTRODE) ×2 IMPLANT
POUCH LAPAROSCOPIC INSTRUMENT (MISCELLANEOUS) ×2 IMPLANT
SCISSORS LAP 5X35 DISP (ENDOMECHANICALS) ×2 IMPLANT
SET CHOLANGIOGRAPH MIX (MISCELLANEOUS) IMPLANT
SET TUBE SMOKE EVAC HIGH FLOW (TUBING) ×2 IMPLANT
SLEEVE Z-THREAD 5X100MM (TROCAR) ×4 IMPLANT
SUT MNCRL AB 4-0 PS2 18 (SUTURE) ×2 IMPLANT
SYSTEM BAG RETRIEVAL 10MM (BASKET) ×2 IMPLANT
TOWEL GREEN STERILE FF (TOWEL DISPOSABLE) ×2 IMPLANT
TRAY LAPAROSCOPIC (CUSTOM PROCEDURE TRAY) ×2 IMPLANT
TROCAR 11X100 Z THREAD (TROCAR) IMPLANT
TROCAR Z THREAD OPTICAL 12X100 (TROCAR) ×2 IMPLANT
TROCAR Z-THREAD OPTICAL 5X100M (TROCAR) ×2 IMPLANT

## 2024-03-04 NOTE — Anesthesia Procedure Notes (Signed)
 Procedure Name: Intubation Date/Time: 03/04/2024 9:27 AM  Performed by: Obadiah Reyes BROCKS, CRNAPre-anesthesia Checklist: Patient identified, Emergency Drugs available, Suction available and Patient being monitored Patient Re-evaluated:Patient Re-evaluated prior to induction Oxygen Delivery Method: Circle System Utilized Preoxygenation: Pre-oxygenation with 100% oxygen Induction Type: IV induction Ventilation: Mask ventilation without difficulty Laryngoscope Size: Miller and 2 Grade View: Grade I Tube type: Oral Tube size: 7.0 mm Number of attempts: 1 Airway Equipment and Method: Stylet and Oral airway Placement Confirmation: ETT inserted through vocal cords under direct vision, positive ETCO2 and breath sounds checked- equal and bilateral Secured at: 21 cm Tube secured with: Tape Dental Injury: Teeth and Oropharynx as per pre-operative assessment

## 2024-03-04 NOTE — Anesthesia Postprocedure Evaluation (Signed)
 Anesthesia Post Note  Patient: Kathryn Jordan  Procedure(s) Performed: LAPAROSCOPIC CHOLECYSTECTOMY (Abdomen)     Patient location during evaluation: PACU Anesthesia Type: General Level of consciousness: awake and alert Pain management: pain level controlled Vital Signs Assessment: post-procedure vital signs reviewed and stable Respiratory status: spontaneous breathing, nonlabored ventilation, respiratory function stable and patient connected to nasal cannula oxygen Cardiovascular status: blood pressure returned to baseline and stable Postop Assessment: no apparent nausea or vomiting Anesthetic complications: no   No notable events documented.  Last Vitals:  Vitals:   03/04/24 1130 03/04/24 1145  BP: (!) 161/92 (!) 141/61  Pulse: 62 61  Resp: 17 17  Temp:    SpO2: 92% 95%    Last Pain:  Vitals:   03/04/24 1145  TempSrc:   PainSc: Asleep                 Thom JONELLE Peoples

## 2024-03-04 NOTE — Transfer of Care (Signed)
 Immediate Anesthesia Transfer of Care Note  Patient: Kathryn Jordan  Procedure(s) Performed: LAPAROSCOPIC CHOLECYSTECTOMY (Abdomen)  Patient Location: PACU  Anesthesia Type:General  Level of Consciousness: sedated and responds to stimulation  Airway & Oxygen Therapy: Patient Spontanous Breathing and Patient connected to face mask oxygen  Post-op Assessment: Report given to RN and Post -op Vital signs reviewed and stable  Post vital signs: Reviewed and stable  Last Vitals:  Vitals Value Taken Time  BP 128/59 03/04/24 10:37  Temp    Pulse 73 03/04/24 10:39  Resp 18 03/04/24 10:39  SpO2 100 % 03/04/24 10:39  Vitals shown include unfiled device data.  Last Pain:  Vitals:   03/04/24 0820  TempSrc: Oral         Complications: No notable events documented.

## 2024-03-04 NOTE — H&P (Signed)
 HPI  Kathryn Jordan is an 68 y.o. female who was seen in clinic on 02/23/24 for biliary dyskinesia  Patient has experienced intermittent RUQ pain since 2015. She does say the pain did go away for a few years at one point but came back in October and has been ongoing ever since. She states that sometimes this is associated with nausea but not always--typically when pain is more severe. Pain is worsened and often aggravated by eating greasy and fried foods.  She had EGD in 2015 due to this pain which showed duodenal inflammation and was started on omeprazole . This has helped somewhat but still experiences the pain with certain foods.  HIDA in 2015 had normal EF of 52%. Repeat HIDA this year showed hyperkinesis with EF of 98%.  Colonoscopy 12/25/22: Two 3-4 mm polyps in ascending colon, mild diverticulosis, melanosis in colon, small internal hemorrhoids -Path: Tubular adenoma without HGD or malignancy. Other fragments of polypoid colonic mucosa with no specific histopathologic changes EGD 11/15/13: Normal mucosa of stomach and esophagus. Duodenal inflammation in bulb -Path: Benign gastric mucosa, no H Pylori, no intestinal metaplasia or active inflammation   No changes since seen in clinic  10 point review of systems is negative except as listed above in HPI.  Objective  Past Medical History: Past Medical History:  Diagnosis Date   Anemia    Anxiety    Arthritis    Cataract    bilateral sx   Depression    GERD (gastroesophageal reflux disease)    Hyperlipidemia    Hypertension    Osteopenia    Right knee pain    Seasonal allergies    Varicose veins     Past Surgical History: Past Surgical History:  Procedure Laterality Date   ABDOMINOPLASTY/PANNICULECTOMY WITH LIPOSUCTION  2019   BLEPHAROPLASTY Bilateral 2021   CATARACT EXTRACTION, BILATERAL Bilateral 2016   COLONOSCOPY      Family History:  Family History  Problem Relation Age of Onset   Heart attack Mother    Heart  disease Mother    Liver disease Father    Diabetes Sister    Diabetes Brother    Hyperlipidemia Neg Hx    Hypertension Neg Hx    Sudden death Neg Hx    Colon cancer Neg Hx    Esophageal cancer Neg Hx    Stomach cancer Neg Hx    Colon polyps Neg Hx    Rectal cancer Neg Hx     Social History:  reports that she has never smoked. She has never used smokeless tobacco. She reports current alcohol use. She reports that she does not currently use drugs after having used the following drugs: Marijuana.  Allergies: No Known Allergies  Medications: I have reviewed the patient's current medications.  Labs: Pertinent lab work personally reviewed.  Imaging: Pertinent imaging personally reviewed  HIDA 01/06/24: Gallbladder EF 98%, patent cystic and common bile ducts HIDA 2015: EF 52%, patent ducts US  09/30/23: No stones, or sludge. No GB wall thickening or pericholecystic fluid. Negative sonographic Murphy's sign   Physical Exam Blood pressure 116/61, pulse 60, temperature 98.3 F (36.8 C), temperature source Oral, resp. rate 14, SpO2 95%. General: No acute distress, well appearing HEENT: PERRL, hearing grossly normal, mucous membranes moist CV: Regular rate and rhythm Pulm: Normal work of breathing on room air Abd: Soft, nontender, nondistended Extremities: Warm and well perfused Neuro: A&O x4, no focal neurologic deficits Psych: Appropriate mood and effect     Assessment  Kathryn Jordan is an 68 y.o. female biliary dyskinesia  Plan  - Proceed to OR for laparoscopic cholecystectomy with ICG - We discussed the etiology of patient's pain, we discussed treatment options and recommended surgery. We discussed details of surgery including general anesthesia, laparoscopic approach, identification of cystic duct and common bile duct. Ligation of cystic duct and cystic artery. Possible need for intraoperative cholangiogram, open procedure, and subtotal cholecystectomy. Possible risks of common  bile duct injury, injury to surrounding structures, bile leak, bleeding, infection, diarrhea, retained stone and hernia. The patient showed good understanding and all questions were answered    Orie Silversmith, MD [facility]

## 2024-03-04 NOTE — Op Note (Signed)
 03/04/2024 10:22 AM  PATIENT: Kathryn Jordan  68 y.o. female  Patient Care Team: Smothers, Barnie SAILOR, NP as PCP - General (Nurse Practitioner)  PRE-OPERATIVE DIAGNOSIS: biliary dyskinesia  POST-OPERATIVE DIAGNOSIS: biliary dyskinesia with chronic cholecystitis  PROCEDURE: Laparoscopic cholecystectomy with ICG  SURGEON: Orie Silversmith, MD  ASSISTANT: None  ANESTHESIA: General endotracheal  EBL: 5cc  DRAINS: None  SPECIMEN: Gallbladder  COUNTS: Sponge, needle and instrument counts were reported correct x2 at the conclusion of the operation  DISPOSITION: PACU in satisfactory condition  COMPLICATIONS: None  FINDINGS: Chronically inflamed gallbladder, ICG showed fluorescence through cystic duct, CBD, and into small intestine. Able to visualize junction of cystic duct and common bile duct, clips placed several cm away from this junction.  DESCRIPTION:  Preoperative indocyanine green was administered in preoperative holding. The patient was identified & brought into the operating room. She was then positioned supine on the OR table. SCDs were in place and active during the entire case. She then underwent general endotracheal anesthesia. Pressure points were padded. Hair on the abdomen was clipped by the OR team. The abdomen was prepped and draped in the standard sterile fashion. Antibiotics were administered. A surgical timeout was performed and confirmed our plan.  A small incision was made in the LUQ at Palmer's point and a veress needle was inserted. Air was aspirated and subsequent positive drop test. Abdomen then insufflated to . A periumbilical incision was then made and a 5mm trocar optiview using a 30 degree scope was inserted and the abdomen was entered under direct visualization. Inspection confirmed no evidence of trocar or veress site complications. The veress was then removed.    The patient was then positioned in reverse Trendelenburg with slight left side down. A 12 mm  supxiphoid trocar was placed under direct visualization and  two additional 5mm trocars were placed along the right subcostal line - one 5mm port in mid subcostal region, another 5mm port in the right flank near the anterior axillary line.  The liver and gallbladder were inspected. Gallbladder chronically inflamed, several omental adhesions that were lysed with combination of gentle blunt dissection and electrocautery The gallbladder fundus was grasped and elevated cephalad. An additional grasper was then placed on the infundibulum of the gallbladder and the infundibulum was retracted laterally. Staying high on the gallbladder, the peritoneum on both sides of the gallbladder was opened with hook cautery. Gentle blunt dissection was then employed with a [place]  dissector working down into [employer]. The cystic duct was identified and carefully circumferentially dissected. The cystic artery was also identified and carefully circumferentially dissected. The space between the cystic artery and hepatocystic plate was developed such that a good view of the liver could be seen through a window medial to the cystic artery. The triangle of Calot had been cleared of all fibrofatty tissue. At this point, a critical view of safety was achieved and the only structures visualized was the skeletonized cystic duct laterally, the skeletonized cystic artery and the liver through the window medial to the artery. No posterior cystic artery was noted  Attention turned to infrared fluorescent cholangiography with indocyanine green which was visualized within the common hepatic duct, common bile duct, cystic duct and small bowel.   The cystic duct and artery were clipped with 2 hemolock clips on the patient side and 1 clip on the specimen side. The cystic duct and artery were then divided. The gallbladder was then freed from its remaining attachments to the liver using electrocautery and placed  into an endocatch bag. The  gallbladder wall was inadvertently violated with some spillage of bile. The remainder of the gallbladder was decompressed with suction to prevent further bile spillage.The RUQ was copiously irrigated with sterile saline. Hemostasis was then verified. The clips were in good position; the gallbladder fossa was dry. The rest of the abdomen was inspected no injury nor bleeding elsewhere was identified.  The endocatch bag containing the gallbladder was then removed from the subxiphoid port site and passed off as specimen. The subxiphoid port fascia was then closed in a figured of eight fashion with 0 vicryl using a suture passer. The RUQ ports were removed under direct visualization and noted to be hemostatic.SABRA The fascia was palpated and noted to be completely closed. The abdomen was then desufflated and the periumbilical trocar removed. The skin of all incision sites was approximated with 4-0 monocryl subcuticular suture and dermabond applied. The patient was then awakened from anesthesia, extubated, and transferred to a stretcher for transport to PACU in satisfactory condition.  Instrument, sponge, and needle counts were correct at closure and at the conclusion of the case.   Orie Silversmith, MD [facility]

## 2024-03-05 ENCOUNTER — Encounter (HOSPITAL_COMMUNITY): Payer: Self-pay | Admitting: General Surgery

## 2024-03-07 LAB — SURGICAL PATHOLOGY

## 2024-04-03 ENCOUNTER — Other Ambulatory Visit: Payer: Self-pay | Admitting: Gastroenterology
